# Patient Record
Sex: Male | Born: 1965 | Race: White | Hispanic: No | Marital: Single | Smoking: Current every day smoker
Health system: Southern US, Community
[De-identification: ages and names within clinical notes are randomized; demographics above are authoritative.]

## PROBLEM LIST (undated history)

## (undated) DIAGNOSIS — I10 Essential (primary) hypertension: Secondary | ICD-10-CM

## (undated) DIAGNOSIS — I2510 Atherosclerotic heart disease of native coronary artery without angina pectoris: Secondary | ICD-10-CM

## (undated) HISTORY — PX: CARDIAC CATHETERIZATION: SHX172

---

## 2004-05-11 ENCOUNTER — Emergency Department: Payer: Self-pay | Admitting: Emergency Medicine

## 2005-04-15 ENCOUNTER — Emergency Department: Payer: Self-pay | Admitting: Internal Medicine

## 2005-09-14 ENCOUNTER — Emergency Department: Payer: Self-pay | Admitting: Emergency Medicine

## 2005-09-17 ENCOUNTER — Other Ambulatory Visit: Payer: Self-pay

## 2005-09-17 ENCOUNTER — Emergency Department: Payer: Self-pay | Admitting: Emergency Medicine

## 2007-05-13 ENCOUNTER — Emergency Department: Payer: Self-pay | Admitting: Emergency Medicine

## 2007-10-04 ENCOUNTER — Emergency Department: Payer: Self-pay | Admitting: Emergency Medicine

## 2007-10-21 ENCOUNTER — Emergency Department: Payer: Self-pay | Admitting: Emergency Medicine

## 2009-06-11 ENCOUNTER — Emergency Department: Payer: Self-pay | Admitting: Emergency Medicine

## 2010-02-01 ENCOUNTER — Emergency Department: Payer: Self-pay | Admitting: Emergency Medicine

## 2010-03-13 ENCOUNTER — Ambulatory Visit: Payer: Self-pay | Admitting: Cardiovascular Disease

## 2011-07-25 ENCOUNTER — Emergency Department: Payer: Self-pay | Admitting: Emergency Medicine

## 2011-07-25 LAB — CBC
HCT: 43.9 % (ref 40.0–52.0)
HGB: 14.2 g/dL (ref 13.0–18.0)
MCH: 30.3 pg (ref 26.0–34.0)
MCHC: 32.3 g/dL (ref 32.0–36.0)
RDW: 13.2 % (ref 11.5–14.5)

## 2011-07-25 LAB — TROPONIN I: Troponin-I: <0.02 ng/mL

## 2011-07-25 LAB — COMPREHENSIVE METABOLIC PANEL
Anion Gap: 7 (ref 7–16)
BUN: 8 mg/dL (ref 7–18)
Bilirubin,Total: 0.5 mg/dL (ref 0.2–1.0)
Chloride: 107 mmol/L (ref 98–107)
Creatinine: 1.1 mg/dL (ref 0.60–1.30)
EGFR (African American): >60
Glucose: 103 mg/dL — ABNORMAL HIGH (ref 65–99)
Osmolality: 276 (ref 275–301)
Potassium: 3.8 mmol/L (ref 3.5–5.1)
Sodium: 139 mmol/L (ref 136–145)
Total Protein: 7.4 g/dL (ref 6.4–8.2)

## 2011-07-25 LAB — CK TOTAL AND CKMB (NOT AT ARMC)
CK, Total: 141 U/L (ref 35–232)
CK-MB: 1 ng/mL (ref 0.5–3.6)

## 2011-08-17 ENCOUNTER — Emergency Department: Payer: Self-pay | Admitting: Emergency Medicine

## 2014-10-29 ENCOUNTER — Inpatient Hospital Stay: Payer: Self-pay

## 2014-10-29 ENCOUNTER — Emergency Department: Payer: Self-pay

## 2014-10-29 ENCOUNTER — Inpatient Hospital Stay
Admission: EM | Admit: 2014-10-29 | Discharge: 2014-10-30 | DRG: 195 | Disposition: A | Discharge status: Home / Self Care | Payer: Self-pay | Attending: Internal Medicine | Admitting: Internal Medicine

## 2014-10-29 DIAGNOSIS — Z91013 Allergy to seafood: Secondary | ICD-10-CM

## 2014-10-29 DIAGNOSIS — I10 Essential (primary) hypertension: Secondary | ICD-10-CM | POA: Diagnosis present

## 2014-10-29 DIAGNOSIS — Z590 Homelessness: Secondary | ICD-10-CM

## 2014-10-29 DIAGNOSIS — I2510 Atherosclerotic heart disease of native coronary artery without angina pectoris: Secondary | ICD-10-CM | POA: Diagnosis present

## 2014-10-29 DIAGNOSIS — I160 Hypertensive urgency: Secondary | ICD-10-CM | POA: Diagnosis present

## 2014-10-29 DIAGNOSIS — F17210 Nicotine dependence, cigarettes, uncomplicated: Secondary | ICD-10-CM | POA: Diagnosis present

## 2014-10-29 DIAGNOSIS — R0789 Other chest pain: Secondary | ICD-10-CM

## 2014-10-29 DIAGNOSIS — J189 Pneumonia, unspecified organism: Principal | ICD-10-CM | POA: Diagnosis present

## 2014-10-29 DIAGNOSIS — R591 Generalized enlarged lymph nodes: Secondary | ICD-10-CM | POA: Diagnosis present

## 2014-10-29 DIAGNOSIS — I252 Old myocardial infarction: Secondary | ICD-10-CM

## 2014-10-29 DIAGNOSIS — R079 Chest pain, unspecified: Secondary | ICD-10-CM

## 2014-10-29 HISTORY — DX: Atherosclerotic heart disease of native coronary artery without angina pectoris: I25.10

## 2014-10-29 HISTORY — DX: Essential (primary) hypertension: I10

## 2014-10-29 LAB — BASIC METABOLIC PANEL
ANION GAP: 9 (ref 5–15)
BUN: 10 mg/dL (ref 6–20)
CALCIUM: 9 mg/dL (ref 8.9–10.3)
CO2: 27 mmol/L (ref 22–32)
CREATININE: 1.08 mg/dL (ref 0.61–1.24)
Chloride: 100 mmol/L — ABNORMAL LOW (ref 101–111)
Glucose, Bld: 101 mg/dL — ABNORMAL HIGH (ref 65–99)
Potassium: 3.5 mmol/L (ref 3.5–5.1)
SODIUM: 136 mmol/L (ref 135–145)

## 2014-10-29 LAB — CBC
HCT: 41.6 % (ref 40.0–52.0)
Hemoglobin: 14 g/dL (ref 13.0–18.0)
MCH: 30.4 pg (ref 26.0–34.0)
MCHC: 33.7 g/dL (ref 32.0–36.0)
MCV: 90.2 fL (ref 80.0–100.0)
PLATELETS: 174 10*3/uL (ref 150–440)
RBC: 4.61 MIL/uL (ref 4.40–5.90)
RDW: 13.5 % (ref 11.5–14.5)
WBC: 18.1 10*3/uL — ABNORMAL HIGH (ref 3.8–10.6)

## 2014-10-29 LAB — TROPONIN I: TROPONIN I: 0.03 ng/mL (ref <0.031)

## 2014-10-29 LAB — TSH: TSH: 0.562 u[IU]/mL (ref 0.350–4.500)

## 2014-10-29 MED ORDER — MORPHINE SULFATE (PF) 2 MG/ML IV SOLN
1.0000 mg | INTRAVENOUS | Status: DC | PRN
  Administered 2014-10-29: 1 mg via INTRAVENOUS
  Filled 2014-10-29: qty 1

## 2014-10-29 MED ORDER — IOHEXOL 350 MG/ML SOLN
75.0000 mL | Freq: Once | INTRAVENOUS | Status: AC | PRN
  Administered 2014-10-29: 75 mL via INTRAVENOUS

## 2014-10-29 MED ORDER — SODIUM CHLORIDE 0.9 % IJ SOLN
3.0000 mL | Freq: Two times a day (BID) | INTRAMUSCULAR | Status: DC
  Administered 2014-10-29 – 2014-10-30 (×2): 3 mL via INTRAVENOUS

## 2014-10-29 MED ORDER — ASPIRIN EC 81 MG PO TBEC
81.0000 mg | DELAYED_RELEASE_TABLET | Freq: Every day | ORAL | Status: DC
Start: 2014-10-29 — End: 2014-10-30
  Administered 2014-10-30: 81 mg via ORAL
  Filled 2014-10-29: qty 1

## 2014-10-29 MED ORDER — ACETAMINOPHEN 650 MG RE SUPP: 650.0000 mg | Freq: Four times a day (QID) | RECTAL | Status: DC | PRN

## 2014-10-29 MED ORDER — METOPROLOL TARTRATE 25 MG PO TABS
25.0000 mg | ORAL_TABLET | Freq: Two times a day (BID) | ORAL | Status: DC
  Administered 2014-10-29 – 2014-10-30 (×2): 25 mg via ORAL
  Filled 2014-10-29 (×2): qty 1

## 2014-10-29 MED ORDER — HYDROCODONE-ACETAMINOPHEN 5-325 MG PO TABS: 1.0000 | ORAL_TABLET | ORAL | Status: DC | PRN

## 2014-10-29 MED ORDER — HEPARIN SODIUM (PORCINE) 5000 UNIT/ML IJ SOLN
5000.0000 [IU] | Freq: Three times a day (TID) | INTRAMUSCULAR | Status: DC
  Filled 2014-10-29: qty 1

## 2014-10-29 MED ORDER — POLYETHYLENE GLYCOL 3350 17 G PO PACK: 17.0000 g | PACK | Freq: Every day | ORAL | Status: DC | PRN

## 2014-10-29 MED ORDER — SODIUM CHLORIDE 0.9 % IV BOLUS (SEPSIS)
1000.0000 mL | Freq: Once | INTRAVENOUS | Status: AC
  Administered 2014-10-29: 1000 mL via INTRAVENOUS

## 2014-10-29 MED ORDER — HYDRALAZINE HCL 20 MG/ML IJ SOLN
10.0000 mg | Freq: Four times a day (QID) | INTRAMUSCULAR | Status: DC | PRN
  Administered 2014-10-29: 10 mg via INTRAVENOUS
  Filled 2014-10-29: qty 1

## 2014-10-29 MED ORDER — DEXTROSE 5 % IV SOLN
1.0000 g | Freq: Once | INTRAVENOUS | Status: AC
  Administered 2014-10-29: 1 g via INTRAVENOUS
  Filled 2014-10-29 (×2): qty 10

## 2014-10-29 MED ORDER — ACETAMINOPHEN 325 MG PO TABS
650.0000 mg | ORAL_TABLET | Freq: Four times a day (QID) | ORAL | Status: DC | PRN
  Administered 2014-10-29: 650 mg via ORAL
  Filled 2014-10-29: qty 2

## 2014-10-29 MED ORDER — LABETALOL HCL 5 MG/ML IV SOLN
10.0000 mg | Freq: Once | INTRAVENOUS | Status: AC
  Administered 2014-10-29: 10 mg via INTRAVENOUS
  Filled 2014-10-29: qty 4

## 2014-10-29 MED ORDER — MORPHINE SULFATE (PF) 2 MG/ML IV SOLN
2.0000 mg | INTRAVENOUS | Status: DC | PRN
  Administered 2014-10-29 (×2): 2 mg via INTRAVENOUS
  Filled 2014-10-29 (×2): qty 1

## 2014-10-29 NOTE — ED Notes (Signed)
Pt back from CT at this time 

## 2014-10-29 NOTE — ED Notes (Signed)
Pt given sandwich tray and sprite per request. Pt calm at this time. Appears comfortable.

## 2014-10-29 NOTE — H&P (Signed)
[facility] - [place] [facility]   PATIENT NAME: Jesus Duarte    MR#:  [id number]  DATE OF BIRTH:  07/16/1965  DATE OF ADMISSION:  10/29/2014  PRIMARY CARE PHYSICIAN: No PCP Per Patient   REQUESTING/REFERRING PHYSICIAN: Dr Carollee MassedKaminski  CHIEF COMPLAINT:   Chief Complaint  Patient presents with  . Chest Pain   HISTORY OF PRESENT ILLNESS:  Jesus Duarte  is a 49 y.o. male with a known history of hypertension and coronary artery disease who has not taken any medications or received medical care for at least the past 5 years presents today with acute onset right-sided chest pain which started on waking this morning. He states that yesterday he was in his usual state of health and felt fine. This morning he woke up with a stabbing pain in the right side of his chest and a cough with minimum sputum production. He states that it is painful to take a deep breath and very painful in the right axilla. He has not had fevers, chills, sweats, nausea vomiting or diarrhea. He has not had any sick contacts. On emergency room evaluation he is found to be very hypertensive and to have a probable right middle lobe pneumonia.  PAST MEDICAL HISTORY:   Past Medical History  Diagnosis Date  . Hypertension   . Coronary artery disease     PAST SURGICAL HISTORY:   Past Surgical History  Procedure Laterality Date  . Cardiac catheterization      SOCIAL HISTORY:   Social History  Substance Use Topics  . Smoking status: Current Every Day Smoker -- 0.25 packs/day    Types: Cigarettes  . Smokeless tobacco: Not on file  . Alcohol Use: No    FAMILY HISTORY:   Family History  Problem Relation Age of Onset  . Colon cancer Mother     DRUG ALLERGIES:   Allergies  Allergen Reactions  . Crab [Shellfish Allergy] Anaphylaxis    REVIEW OF SYSTEMS:   Review of Systems  Constitutional: Negative for fever, chills, weight loss and malaise/fatigue.  HENT: Positive for  congestion. Negative for hearing loss.   Eyes: Negative for blurred vision and pain.  Respiratory: Positive for cough, sputum production and shortness of breath. Negative for hemoptysis and stridor.   Cardiovascular: Positive for chest pain. Negative for palpitations, orthopnea and leg swelling.  Gastrointestinal: Negative for nausea, vomiting, abdominal pain, diarrhea, constipation and blood in stool.  Genitourinary: Negative for dysuria and frequency.  Musculoskeletal: Negative for myalgias, back pain, joint pain and neck pain.  Skin: Negative for rash.  Neurological: Negative for focal weakness, loss of consciousness and headaches.  Endo/Heme/Allergies: Does not bruise/bleed easily.  Psychiatric/Behavioral: Negative for depression and hallucinations. The patient is not nervous/anxious.     MEDICATIONS AT HOME:   Prior to Admission medications   Medication Sig Start Date End Date Taking? Authorizing Provider  aspirin (BAYER ASPIRIN EC LOW DOSE) 81 MG EC tablet Take 81 mg by mouth daily. Swallow whole.   Yes Historical Provider, MD      VITAL SIGNS:  Blood pressure 218/117, pulse 107, temperature 98.8 F (37.1 C), temperature source Oral, resp. rate 21, height 5\' 7"  (1.702 m), weight 68.04 kg (150 lb), SpO2 97 %.  PHYSICAL EXAMINATION:  GENERAL:  49 y.o.-year-old patient lying in the bed, uncomfortable, short shallow respirations EYES: Pupils equal, round, reactive to light and accommodation. No scleral icterus. Extraocular muscles intact.  HEENT: Head atraumatic, normocephalic. Oropharynx and nasopharynx clear.  NECK:  Supple, no jugular venous distention. No thyroid enlargement, no tenderness. No lymphadenopathy LUNGS: Normal breath sounds bilaterally, no wheezing, rales, rhonchi or crepitation. Short shallow respirations, wincing in pain with deep inspiration  CARDIOVASCULAR: S1, S2 normal. No murmurs, rubs, or gallops. Tachycardic ABDOMEN: Soft, nontender, nondistended. Bowel  sounds present. No organomegaly or mass. No guarding no rebound EXTREMITIES: No pedal edema, cyanosis, or clubbing. Peripheral pulses are 2+ NEUROLOGIC: Cranial nerves II through XII are intact. Muscle strength 5/5 in all extremities. Sensation intact. Gait not checked.  PSYCHIATRIC: The patient is alert and oriented x 3. Anxious SKIN: No obvious rash, lesion, or ulcer.   LABORATORY PANEL:   CBC  Recent Labs Lab 10/29/14 1531  WBC 18.1*  HGB 14.0  HCT 41.6  PLT 174   ------------------------------------------------------------------------------------------------------------------  Chemistries   Recent Labs Lab 10/29/14 1531  NA 136  K 3.5  CL 100*  CO2 27  GLUCOSE 101*  BUN 10  CREATININE 1.08  CALCIUM 9.0   ------------------------------------------------------------------------------------------------------------------  Cardiac Enzymes  Recent Labs Lab 10/29/14 1531  TROPONINI <0.03   ------------------------------------------------------------------------------------------------------------------  RADIOLOGY:  Dg Chest 2 View  10/29/2014  CLINICAL DATA:  49 year old male with acute right chest pain and shortness of breath for 1 day. EXAM: CHEST  2 VIEW COMPARISON:  None. FINDINGS: The cardiomediastinal silhouette is unremarkable. Confluent right middle lobe opacity identified and may represent pneumonia but correlate clinically. There is no evidence of pneumothorax, pleural effusion or pulmonary edema. No acute bony abnormalities are identified. IMPRESSION: Right middle lobe opacity likely representing pneumonia but correlate clinically. If infection is not clinically suspected, recommend chest CT with contrast/CTA chest. Otherwise radiographic follow-up to resolution is recommended. Electronically Signed   By: Harmon Pier M.D.   On: 10/29/2014 15:56    EKG:   Orders placed or performed during the hospital encounter of 10/29/14  . EKG 12-Lead  . EKG 12-Lead   . ED EKG within 10 minutes  . ED EKG within 10 minutes    IMPRESSION AND PLAN:   #1 right-sided chest pain: Chest x-ray showing probable right middle lobe pneumonia and he does have a significant leukocytosis. I will obtain a CT angiogram to rule out PE as he is tachycardic and having pleuritic type chest pain. I think he most likely has pneumonia rather than a PE. Blood cultures have been drawn. He has been started on Rocephin.  #2 hypertensive urgency: Blood pressure 230/115 on presentation. He has received labetalol in the emergency room. If this is not successful we'll need to admit to the stepdown unit for management. Will start hydralazine as needed for hypertension. He denies vision change headache neurologic symptoms.  #3 history of coronary artery disease: We will cycle cardiac enzymes initial troponin is negative and first EKG shows sinus tachycardia. There are possible changes of old MI. Will obtain 2-D echocardiogram and admit to telemetry. Continue aspirin daily. Start beta blocker. Check lipids in a.m.  #4 homelessness: Child psychotherapist consultation  CODE STATUS: full  TOTAL TIME TAKING CARE OF THIS PATIENT: 45 minutes.  Greater than 50% of time spent in coordination of care and counseling.  Elby Showers M.D on 10/29/2014 at 4:57 PM  Between 7am to 6pm - Pager - [phone]  After 6pm go to [email or web address] - password [email or web address] [facility]  [facility]  Office  [phone]  CC: Primary care physician; No PCP Per Patient

## 2014-10-29 NOTE — ED Provider Notes (Signed)
[facility] Emergency Department Provider Note  ____________________________________________  Time seen: 1612  I have reviewed the triage vital signs and the nursing notes.   HISTORY  Chief Complaint Chest Pain     HPI Jesus Duarte is a 49 y.o. male who woke this morning with pain in his right chest and shortness of breath.  Patient is homeless. [place]. With the discomfort he had today, he was able to get a ride towards town. This was at 8 AM this morning. It took a while to finally get to the hospital. He reports ongoing pain in the right chest with shortness of breath. Taking a deep breath hurts. He is breathing a little bit shallow.  Patient does have a history of an MI in the past. This is an 2003. At this time, he has not had any ongoing medications, including medications for his hypertension.  Patient denies any fever. He does report he has some pain in his right shoulder and took an oxycodone the other day for this.     Past medical history: Positive for cardiac disease with myocardial infarction, catheterization in 2003. Hypertension, untreated  There are no active problems to display for this patient.   Past Surgical History  Procedure Laterality Date  . Cardiac catheterization      No current outpatient prescriptions on file.  Allergies Crab  No family history on file.  Social History Social History  Substance Use Topics  . Smoking status: Current Every Day Smoker  . Smokeless tobacco: None  . Alcohol Use: Yes    Review of Systems  Constitutional: Negative for fever. ENT: Negative for sore throat. Cardiovascular: Positive for chest pain. Respiratory: Pain with inspiration, shortness of breath. Gastrointestinal: Negative for abdominal pain, vomiting and diarrhea. Genitourinary: Negative for dysuria. Musculoskeletal: No myalgias or injuries. Skin: Negative for rash. Neurological:  Negative for paresthesia or weakness   10-point ROS otherwise negative.  ____________________________________________   PHYSICAL EXAM:  VITAL SIGNS: ED Triage Vitals  Enc Vitals Group     BP 10/29/14 1519 225/112 mmHg     Pulse Rate 10/29/14 1519 108     Resp 10/29/14 1519 20     Temp 10/29/14 1519 98.8 F (37.1 C)     Temp Source 10/29/14 1519 Oral     SpO2 10/29/14 1519 100 %     Weight 10/29/14 1519 150 lb (68.04 kg)     Height 10/29/14 1519 5\' 7"  (1.702 m)     Head Cir --      Peak Flow --      Pain Score 10/29/14 1520 10     Pain Loc --      Pain Edu? --      Excl. in GC? --     Constitutional: Alert and oriented. Appears uncomfortable with shallow breaths, mild tachypnea, but communicative. ENT   Head: Normocephalic and atraumatic.   Nose: No congestion/rhinnorhea.    Cardiovascular: Tachycardic at 1:15, regular rhythm, no murmur noted Respiratory:  Mild increased risk for a rate with shallow breathing and pain on inspiration. No wheezing. Breath sounds overall equal bilaterally. Gastrointestinal: Soft and nontender. No distention.  Back: No muscle spasm, no tenderness, no CVA tenderness. Musculoskeletal: No deformity noted. Nontender with normal range of motion in all extremities.  No noted edema. Neurologic:  Normal speech and language. No gross focal neurologic deficits are appreciated.  Skin:  Skin is warm, dry. No rash noted. Psychiatric: Mood and  affect are normal. Speech and behavior are normal.  ____________________________________________    LABS (pertinent positives/negatives)  Labs Reviewed  BASIC METABOLIC PANEL - Abnormal; Notable for the following:    Chloride 100 (*)    Glucose, Bld 101 (*)    All other components within normal limits  CBC - Abnormal; Notable for the following:    WBC 18.1 (*)    All other components within normal limits  CULTURE, BLOOD (ROUTINE X 2)  CULTURE, BLOOD (ROUTINE X 2)  TROPONIN I      ____________________________________________   EKG  ED ECG REPORT I, Kyllie Pettijohn W, the attending physician, personally viewed and interpreted this ECG.   Date: 10/29/2014  EKG Time: 1515  Rate: 107  Rhythm: Sinus tachycardia  Axis: Normal  Intervals: Normal  ST&T Change: No ST elevation or depression noted. Slightly tall T waves in V3 through V5   ____________________________________________    RADIOLOGY   Chest x-ray: IMPRESSION: Right middle lobe opacity likely representing pneumonia but correlate clinically. If infection is not clinically suspected, recommend chest CT with contrast/CTA chest. Otherwise radiographic follow-up to resolution is recommended.  ____________________________________________   PROCEDURES  CRITICAL CARE Performed by: Darien Ramus   Total critical care time: 30 minutes due to the respiratory distress issues with this patient with significant pneumonia, leukocytosis, tachycardia, and tachypnea.  Critical care time was exclusive of separately billable procedures and treating other patients.  Critical care was necessary to treat or prevent imminent or life-threatening deterioration.  Critical care was time spent personally by me on the following activities: development of treatment plan with patient and/or surrogate as well as nursing, discussions with consultants, evaluation of patient's response to treatment, examination of patient, obtaining history from patient or surrogate, ordering and performing treatments and interventions, ordering and review of laboratory studies, ordering and review of radiographic studies, pulse oximetry and re-evaluation of patient's condition.   ____________________________________________   INITIAL IMPRESSION / ASSESSMENT AND PLAN / ED COURSE  Pertinent labs & imaging results that were available during my care of the patient were reviewed by me and considered in my medical decision making (see  chart for details).  Well-nourished well-developed muscular-appearing 49 year old male who is homeless and has no ongoing health care. He is not on any blood pressure medications currently. He presents with acute right-sided chest pain, pleuritic in nature, with shortness of breath.  Chest x-ray shows a pneumonia on the right. He does have a leukocytosis.  We will start providing IV fluids and also treat his elevated blood pressure with labetalol IV.  We will move to admit the patient to the hospital and speak with the [facility] hospitalist service for evaluation.  ____________________________________________   FINAL CLINICAL IMPRESSION(S) / ED DIAGNOSES  Final diagnoses:  Community acquired pneumonia  Other chest pain  Hypertensive urgency      Darien Ramus, MD 10/29/14 2356

## 2014-10-29 NOTE — ED Notes (Addendum)
Pt c/o waking this morning with right sided  Pain in chest, worse with movement and deep breathing..Marland Kitchen

## 2014-10-29 NOTE — ED Notes (Signed)
Pt states he awoke with CP to right chest, sharp and shooting, constant since this AM. Pt appears anxious, states he is scared. Pt hx of hypertension, does not take medications. Pt talking quickly. Color WNL. Nausea and vomiting this AM, SOB.

## 2014-10-29 NOTE — ED Notes (Signed)
MD at bedside. 

## 2014-10-29 NOTE — ED Notes (Signed)
Pt to CT

## 2014-10-29 NOTE — ED Notes (Signed)
Pt up in room walking around, taking monitor cords off. Pt states that he is leaving and going to [facility]. Pt states that his pain was controlled until "he coughed". Pt states that he is in pain. Explained to patient the need to stay. Pt appears angry, irritated. Dr Clent RidgesWalsh paged, in room to speak with patient. Orders for morphine given. Given to patient. Pt sitting on side of bed on telephone texting, pt will not wear BP cuff at this time.

## 2014-10-30 ENCOUNTER — Inpatient Hospital Stay
Admit: 2014-10-30 | Discharge: 2014-10-30 | Disposition: A | Discharge status: Home / Self Care | Payer: Self-pay | Attending: Internal Medicine | Admitting: Internal Medicine

## 2014-10-30 LAB — BASIC METABOLIC PANEL
Anion gap: 7 (ref 5–15)
BUN: 13 mg/dL (ref 6–20)
CHLORIDE: 103 mmol/L (ref 101–111)
CO2: 26 mmol/L (ref 22–32)
CREATININE: 0.91 mg/dL (ref 0.61–1.24)
Calcium: 8.4 mg/dL — ABNORMAL LOW (ref 8.9–10.3)
GFR calc Af Amer: >60 mL/min (ref >60)
GFR calc non Af Amer: >60 mL/min (ref >60)
GLUCOSE: 104 mg/dL — AB (ref 65–99)
POTASSIUM: 3.7 mmol/L (ref 3.5–5.1)
SODIUM: 136 mmol/L (ref 135–145)

## 2014-10-30 LAB — CBC
HCT: 38.8 % — ABNORMAL LOW (ref 40.0–52.0)
HEMOGLOBIN: 13 g/dL (ref 13.0–18.0)
MCH: 30.1 pg (ref 26.0–34.0)
MCHC: 33.4 g/dL (ref 32.0–36.0)
MCV: 90.1 fL (ref 80.0–100.0)
PLATELETS: 161 10*3/uL (ref 150–440)
RBC: 4.3 MIL/uL — AB (ref 4.40–5.90)
RDW: 13.4 % (ref 11.5–14.5)
WBC: 16.8 10*3/uL — ABNORMAL HIGH (ref 3.8–10.6)

## 2014-10-30 LAB — TROPONIN I
TROPONIN I: 0.03 ng/mL (ref <0.031)
TROPONIN I: 0.04 ng/mL — AB (ref <0.031)

## 2014-10-30 LAB — LIPID PANEL
CHOL/HDL RATIO: 3.2 ratio
CHOLESTEROL: 98 mg/dL (ref 0–200)
HDL: 31 mg/dL — ABNORMAL LOW (ref >40)
LDL Cholesterol: 56 mg/dL (ref 0–99)
TRIGLYCERIDES: 54 mg/dL (ref <150)
VLDL: 11 mg/dL (ref 0–40)

## 2014-10-30 MED ORDER — METOPROLOL TARTRATE 25 MG PO TABS: 25.0000 mg | ORAL_TABLET | Freq: Two times a day (BID) | ORAL | Status: DC

## 2014-10-30 MED ORDER — DEXTROSE 5 % IV SOLN
1.0000 g | INTRAVENOUS | Status: DC
  Filled 2014-10-30: qty 10

## 2014-10-30 MED ORDER — AMOXICILLIN-POT CLAVULANATE 875-125 MG PO TABS: 1.0000 | ORAL_TABLET | Freq: Two times a day (BID) | ORAL | Status: AC

## 2014-10-30 NOTE — Progress Notes (Signed)
*  PRELIMINARY RESULTS* Echocardiogram 2D Echocardiogram has been performed.  Georgann HousekeeperJerry R Hege 10/30/2014, 3:44 PM

## 2014-10-30 NOTE — Discharge Instructions (Signed)

## 2014-10-30 NOTE — Care Management (Signed)
CM will follow patient for medication assistance.

## 2014-10-30 NOTE — Clinical Social Work Note (Signed)
Clinical Social Work Assessment  Patient Details  Name: Jesus Duarte MRN: [id number] Date of Birth: 04/08/1965  Date of referral:  10/30/14               Reason for consult:  Housing Concerns/Homelessness                Permission sought to share information with:  Family Supports Permission granted to share information::  Yes, Verbal Permission Granted  Name::        Agency::     Relationship::     Contact Information:     Housing/Transportation Living arrangements for the past 2 months:  Homeless Source of Information:  Patient Patient Interpreter Needed:  None Criminal Activity/Legal Involvement Pertinent to Current Situation/Hospitalization:  No - Comment as needed Significant Relationships:  Other Family Members Lives with:  Self Do you feel safe going back to the place where you live?  Yes Need for family participation in patient care:  No (Coment)  Care giving concerns:  Patient is able to care for himself   Social Worker assessment / plan:  CSW received consult for homelessness concerns. CSW went to see patient and patient was on the phone visibly frustrated trying to find a ride. Patient had visitors that he confirmed were family (an older lady and a young child). The visitors did not say anything. Patient got off of phone and CSW attempted to speak with him regarding his current situation. Patient stated he was trying to find a ride out of the hospital because he stated that he was told he would discharge today. When asked if he was also working on housing, he replied "yes I need a house, can you find me a house?" I asked if he was an [place] resident and he would not answer this directly. He stated that he did not have a permanent [place] address but that he had been in [place] a while. When CSW offered the homeless shelter, patient replied that he was not going to the homeless shelter and that he "lived in that area anyway." Patient stated he had a  lot of family in the area and that he would stay in that area but he would not go to the homeless shelter. Patient then got back on one of the cell phones that was in front of him. CSW completed assessment at that time.  Employment status:  Unemployed Health and safety inspectornsurance information:  Self Pay (Medicaid Pending) PT Recommendations:    Information / Referral to community resources:   homeless shelter  Patient/Family's Response to care:  Patient declined assistance of the homeless shelter.  Patient/Family's Understanding of and Emotional Response to Diagnosis, Current Treatment, and Prognosis:  Patient frustrated at the current task of finding a ride from the hospital.  Emotional Assessment Appearance:  Appears stated age, Disheveled Attitude/Demeanor/Rapport:   (frustrated) Affect (typically observed):    Orientation:  Oriented to Self, Oriented to Place, Oriented to  Time, Oriented to Situation Alcohol / Substance use:  Not Applicable Psych involvement (Current and /or in the community):  No (Comment)  Discharge Needs  Concerns to be addressed:  Homelessness Readmission within the last 30 days:  No Current discharge risk:  Homeless Barriers to Discharge:  No Barriers Identified   York SpanielMonica Cade Dashner, LCSW 10/30/2014, 2:02 PM

## 2014-10-30 NOTE — Care Management (Addendum)
Patient is homeless but says that he is going to call his Alisia FerrariUncle Ken to come and pick him up and "is making plans to stay in [place].  Patient is choosing to discharge today.  Attending spoke with patient about his diagnosis of pneumonia with temp last evening of 102.  Patient says was seen at [facility] about 5 years ago.  "Lost  his medicaid due to Obama."  Confirmed that [facility] has Augmentin and metoprolol  on hand.  Script faxed and provided patient with applications for [facility] and [facility].

## 2014-10-30 NOTE — Progress Notes (Signed)
Patient d/c'd home. Education provided, no questions at this time. Patient to be picked up by family friend. Telemetry removed. Jolicia Delira R Mansfield  

## 2014-10-30 NOTE — Progress Notes (Signed)
MD made aware of troponin of 0.04. No new orders received. Cont to monitor

## 2014-10-31 NOTE — Discharge Summary (Signed)
[facility] - [place] at [facility]   PATIENT NAME: Jesus Duarte    MR#:  [id number]  DATE OF BIRTH:  1965/07/05  DATE OF ADMISSION:  10/29/2014 ADMITTING PHYSICIAN: Gale Journey, MD  DATE OF DISCHARGE: 10/30/2014  3:42 PM  PRIMARY CARE PHYSICIAN: [facility]  ADMISSION DIAGNOSIS:  Other chest pain [R07.89] Community acquired pneumonia [J18.9] Hypertensive urgency [I16.0] Right-sided chest pain [R07.9] DISCHARGE DIAGNOSIS:  Active Problems:   Hypertensive urgency  pneumonia SECONDARY DIAGNOSIS:   Past Medical History  Diagnosis Date  . Hypertension   . Coronary artery disease    HOSPITAL COURSE:  49 y.o. male with a known history of hypertension and coronary artery disease who has not taken any medications or received medical care for at least the past 5 years ADMITTED FOR acute onset right-sided chest pain.  See Dr. Claris Che dictated history and physical for further details.  Patient was ruled out with 3 negative serial troponins.  Due to his ongoing chest pain.  He underwent CT scan of the chest which showed pneumonia which was thought to be contributed into his chest pain as he also had lymphadenopathy.  He was started on antibiotics and was improving, although he did have some fever, but he was very adamant wanting to go out of the hospital.  He was explained the risks and benefit of staying in the hospital.  Considering his ongoing fever due to pneumonia.  He and his family agreed to leave with antibiotics and monitor at home.  Discharged home in stable condition. DISCHARGE CONDITIONS:  stable CONSULTS OBTAINED:    DRUG ALLERGIES:   Allergies  Allergen Reactions  . Crab [Shellfish Allergy] Anaphylaxis   DISCHARGE MEDICATIONS:   Discharge Medication List as of 10/30/2014  3:10 PM    START taking these medications   Details  amoxicillin-clavulanate (AUGMENTIN) 875-125 MG tablet Take 1 tablet by mouth 2 (two) times daily., Starting  10/30/2014, Until Mon 11/06/14, Normal    metoprolol tartrate (LOPRESSOR) 25 MG tablet Take 1 tablet (25 mg total) by mouth 2 (two) times daily., Starting 10/30/2014, Until Discontinued, Normal      CONTINUE these medications which have NOT CHANGED   Details  aspirin (BAYER ASPIRIN EC LOW DOSE) 81 MG EC tablet Take 81 mg by mouth daily. Swallow whole., Until Discontinued, Historical Med       DISCHARGE INSTRUCTIONS:   DIET:  Regular diet DISCHARGE CONDITION:  Stable ACTIVITY:  Activity as tolerated OXYGEN:  Home Oxygen: No.  Oxygen Delivery: room air DISCHARGE LOCATION:  home   If you experience worsening of your admission symptoms, develop shortness of breath, life threatening emergency, suicidal or homicidal thoughts you must seek medical attention immediately by calling 911 or calling your MD immediately  if symptoms less severe.  You Must read complete instructions/literature along with all the possible adverse reactions/side effects for all the Medicines you take and that have been prescribed to you. Take any new Medicines after you have completely understood and accpet all the possible adverse reactions/side effects.   Please note  You were cared for by a hospitalist during your hospital stay. If you have any questions about your discharge medications or the care you received while you were in the hospital after you are discharged, you can call the unit and asked to speak with the hospitalist on call if the hospitalist that took care of you is not available. Once you are discharged, your primary care physician will handle any further  medical issues. Please note that NO REFILLS for any discharge medications will be authorized once you are discharged, as it is imperative that you return to your primary care physician (or establish a relationship with a primary care physician if you do not have one) for your aftercare needs so that they can reassess your need for medications and  monitor your lab values.    On the day of Discharge:  VITAL SIGNS:  Blood pressure 163/93, pulse 75, temperature 97.5 F (36.4 C), temperature source Oral, resp. rate 18, height 5\' 7"  (1.702 m), weight 68.04 kg (150 lb), SpO2 98 %. PHYSICAL EXAMINATION:  GENERAL:  49 y.o.-year-old patient lying in the bed with no acute distress.  EYES: Pupils equal, round, reactive to light and accommodation. No scleral icterus. Extraocular muscles intact.  HEENT: Head atraumatic, normocephalic. Oropharynx and nasopharynx clear.  NECK:  Supple, no jugular venous distention. No thyroid enlargement, no tenderness.  LUNGS: Normal breath sounds bilaterally, no wheezing, rales,rhonchi or crepitation. No use of accessory muscles of respiration.  CARDIOVASCULAR: S1, S2 normal. No murmurs, rubs, or gallops.  ABDOMEN: Soft, non-tender, non-distended. Bowel sounds present. No organomegaly or mass.  EXTREMITIES: No pedal edema, cyanosis, or clubbing.  NEUROLOGIC: Cranial nerves II through XII are intact. Muscle strength 5/5 in all extremities. Sensation intact. Gait not checked.  PSYCHIATRIC: The patient is alert and oriented x 3.  SKIN: No obvious rash, lesion, or ulcer.  DATA REVIEW:   CBC  Recent Labs Lab 10/30/14 0459  WBC 16.8*  HGB 13.0  HCT 38.8*  PLT 161    Chemistries   Recent Labs Lab 10/30/14 0459  NA 136  K 3.7  CL 103  CO2 26  GLUCOSE 104*  BUN 13  CREATININE 0.91  CALCIUM 8.4*    Cardiac Enzymes  Recent Labs Lab 10/30/14 0459  TROPONINI 0.04*   RADIOLOGY:  Ct Angio Chest Pe W/cm &/or Wo Cm  10/29/2014  CLINICAL DATA:  Right chest pain EXAM: CT ANGIOGRAPHY CHEST WITH CONTRAST TECHNIQUE: Multidetector CT imaging of the chest was performed using the standard protocol during bolus administration of intravenous contrast. Multiplanar CT image reconstructions and MIPs were obtained to evaluate the vascular anatomy. CONTRAST:  75mL OMNIPAQUE IOHEXOL 350 MG/ML SOLN COMPARISON:   Chest radiographs dated 10/29/2014 FINDINGS: No evidence of pulmonary embolism. Mediastinum/Nodes: The heart is normal in size. No pericardial effusion. 20 mm short axis right hilar/infrahilar node (series 4/ image 31). Visualized thyroid is unremarkable. Lungs/Pleura: Patchy opacity in the inferomedial right upper lobe (series 6/ image 101), corresponding to the radiographic abnormality, compatible with pneumonia. Mild dependent atelectasis in the bilateral lower lobes. No suspicious pulmonary nodules. Mild centrilobular and paraseptal emphysematous changes. No pleural effusion or pneumothorax. Upper abdomen: Visualized upper abdomen is unremarkable. Musculoskeletal: Mild degenerative changes of the lower cervical and lower thoracic spine. Review of the MIP images confirms the above findings. IMPRESSION: No evidence of pulmonary embolism. Patchy opacity in the inferomedial right upper lobe, corresponding to the radiographic abnormality, compatible with pneumonia. Right hilar/infrahilar lymph node, likely reactive. Electronically Signed   By: Charline BillsSriyesh  Krishnan M.D.   On: 10/29/2014 17:28     Management plans discussed with the patient, family and they are in agreement.  CODE STATUS: Full code  TOTAL TIME TAKING CARE OF THIS PATIENT: 55 minutes.    [facility], Soumya Colson M.D on 10/31/2014 at 5:06 PM  Between 7am to 6pm - Pager - [phone]  After 6pm go to [email or web address] - password [email or web address] [facility]  [place]  [place] [facility]  Office  [phone]  CC: Primary care physician; [facility]

## 2014-11-04 LAB — CULTURE, BLOOD (ROUTINE X 2)
CULTURE: NO GROWTH
Culture: NO GROWTH

## 2017-04-09 ENCOUNTER — Ambulatory Visit: Payer: Self-pay | Admitting: Ophthalmology

## 2017-04-09 LAB — HM DIABETES EYE EXAM

## 2017-04-14 ENCOUNTER — Ambulatory Visit: Payer: Self-pay | Admitting: Family Medicine

## 2017-04-14 VITALS — BP 232/125 | HR 98 | Temp 98.2°F | Wt 157.1 lb

## 2017-04-14 DIAGNOSIS — Z0000 Encounter for general adult medical examination without abnormal findings: Secondary | ICD-10-CM

## 2017-04-14 NOTE — Progress Notes (Signed)
   Subjective:    Patient ID: Jesus Duarte, male    DOB: 12/11/1965, 52 y.o.   MRN: [id number]  HPI   Jesus Duarte is a 52 yo male here for f/u of blurry vision bilateral. 10 days ago, he woke up blind. He saw the Optometrist last week and was given Rx glasses and was referred to us to f/u for BP and diabetes. He reports his vision has improved and has not experienced blurry vision or blindness.  HTN: Pt has history of HTN. BP is elevated 232/125 and pt reports that is "normal" for him. He was Rx Lopressor 25mg  10 yrs ago but stopped taking it because he didn't want to take it.   Patient Active Problem List   Diagnosis Date Noted  . Hypertensive urgency 10/29/2014   Allergies as of 04/14/2017      Reactions   Crab [shellfish Allergy] Anaphylaxis   Eggs Or Egg-derived Products Swelling      Medication List        Accurate as of 04/14/17  7:57 PM. Always use your most recent med list.          BAYER ASPIRIN EC LOW DOSE 81 MG EC tablet Generic drug:  aspirin Take 81 mg by mouth daily. Swallow whole.   metoprolol tartrate 25 MG tablet Commonly known as:  LOPRESSOR Take 1 tablet (25 mg total) by mouth 2 (two) times daily.        Review of Systems     Objective:   Physical Exam  Constitutional: He is oriented to person, place, and time. He appears well-developed and well-nourished.  Cardiovascular: Normal rate, regular rhythm and normal heart sounds.  Pulmonary/Chest: Effort normal and breath sounds normal.  Abdominal: Soft. Bowel sounds are normal.  Neurological: He is alert and oriented to person, place, and time.  Vitals reviewed.   BP (!) 232/125 (BP Location: Right Arm, Patient Position: Sitting)   Pulse 98   Temp 98.2 F (36.8 C)   Wt 157 lb 1.6 oz (71.3 kg)   BMI 24.61 kg/m        Assessment & Plan:   Vision: Keep f/u apt for optho. Discussed w/ pt to go to ED if he experiences blindness again.   HTN: Not on meds at this time per pt preference.    Discussed w/ pt signs of heart attack and stroke and to call 911.   Pt refused for assistance w/ other medical issues.   F/u in 3 mo for maintenance of HTN

## 2017-04-15 ENCOUNTER — Other Ambulatory Visit: Payer: Self-pay | Admitting: Ophthalmology

## 2017-07-09 ENCOUNTER — Encounter: Payer: Self-pay | Admitting: Emergency Medicine

## 2017-07-09 ENCOUNTER — Emergency Department: Payer: Self-pay

## 2017-07-09 ENCOUNTER — Emergency Department
Admission: EM | Admit: 2017-07-09 | Discharge: 2017-07-09 | Disposition: A | Discharge status: Home / Self Care | Payer: Self-pay | Attending: Emergency Medicine | Admitting: Emergency Medicine

## 2017-07-09 DIAGNOSIS — F17210 Nicotine dependence, cigarettes, uncomplicated: Secondary | ICD-10-CM | POA: Insufficient documentation

## 2017-07-09 DIAGNOSIS — W010XXA Fall on same level from slipping, tripping and stumbling without subsequent striking against object, initial encounter: Secondary | ICD-10-CM | POA: Insufficient documentation

## 2017-07-09 DIAGNOSIS — Z7982 Long term (current) use of aspirin: Secondary | ICD-10-CM | POA: Insufficient documentation

## 2017-07-09 DIAGNOSIS — Y939 Activity, unspecified: Secondary | ICD-10-CM | POA: Insufficient documentation

## 2017-07-09 DIAGNOSIS — I10 Essential (primary) hypertension: Secondary | ICD-10-CM

## 2017-07-09 DIAGNOSIS — I2510 Atherosclerotic heart disease of native coronary artery without angina pectoris: Secondary | ICD-10-CM | POA: Insufficient documentation

## 2017-07-09 DIAGNOSIS — Y929 Unspecified place or not applicable: Secondary | ICD-10-CM | POA: Insufficient documentation

## 2017-07-09 DIAGNOSIS — K029 Dental caries, unspecified: Secondary | ICD-10-CM | POA: Insufficient documentation

## 2017-07-09 DIAGNOSIS — S82831A Other fracture of upper and lower end of right fibula, initial encounter for closed fracture: Secondary | ICD-10-CM | POA: Insufficient documentation

## 2017-07-09 DIAGNOSIS — I119 Hypertensive heart disease without heart failure: Secondary | ICD-10-CM | POA: Insufficient documentation

## 2017-07-09 DIAGNOSIS — Y999 Unspecified external cause status: Secondary | ICD-10-CM | POA: Insufficient documentation

## 2017-07-09 MED ORDER — NAPROXEN 500 MG PO TABS
500.0000 mg | ORAL_TABLET | Freq: Once | ORAL | Status: AC
  Administered 2017-07-09: 500 mg via ORAL
  Filled 2017-07-09: qty 1

## 2017-07-09 MED ORDER — NAPROXEN 500 MG PO TABS: 500.0000 mg | ORAL_TABLET | Freq: Two times a day (BID) | ORAL | 0 refills | Status: AC

## 2017-07-09 NOTE — ED Triage Notes (Signed)
Pt comes into the ED via POV c/o foot and ankle pain.  Patient states he walked off his porch and fell.  Patient has swelling to the right foot at this time but is ambulatory on crutches at this time.  Patient in NAD at this time with even and unlabored respirations.

## 2017-07-09 NOTE — Discharge Instructions (Addendum)
Your blood pressure is very high today. You will need to see a primary care provider as soon as possible. Call and schedule a follow up appointment with podiatry. Do not remove the cast. Avoid weight bearing and use your crutches any time you are up. Apply ice over the cast 20 minutes per hour while awake. Return to the ER for symptoms that change or worsen or for new concerns.

## 2017-07-09 NOTE — ED Provider Notes (Signed)
[facility] Emergency Department Provider Note ____________________________________________  Time seen: Approximately 3:24 PM  I have reviewed the triage vital signs and the nursing notes.   HISTORY  Chief Complaint Foot Pain    HPI Jesus Duarte is a 52 y.o. male who presents to the emergency department for evaluation and treatment of right ankle and foot pain.Patient states he "fell down while lying flat" last night. He also states that he "might have walked off of the porch." He also states that he was "labeled a druggie" because [facility] was delivering heroin to his driveway then decided they weren't going to do it anymore and now no one will help him with pain. He feels he needs a morphine prescription and a sulfa antibiotic prescription for chronic dental pain. He is aware that his blood pressure is "always real high" but it "doesn't bother" him. He denies any alleviating measures for the pain in his foot and ankle prior to arrival.  Past Medical History:  Diagnosis Date  . Coronary artery disease   . Hypertension     Patient Active Problem List   Diagnosis Date Noted  . Hypertensive urgency 10/29/2014    Past Surgical History:  Procedure Laterality Date  . CARDIAC CATHETERIZATION      Prior to Admission medications   Medication Sig Start Date End Date Taking? Authorizing Provider  aspirin (BAYER ASPIRIN EC LOW DOSE) 81 MG EC tablet Take 81 mg by mouth daily. Swallow whole.    [provider]  naproxen (NAPROSYN) 500 MG tablet Take 1 tablet (500 mg total) by mouth 2 (two) times daily with a meal. 07/09/17   Shaquayla Klimas B, FNP    Allergies Crab [shellfish allergy] and Eggs or egg-derived products  Family History  Problem Relation Age of Onset  . Colon cancer Mother     Social History Social History   Tobacco Use  . Smoking status: Current Every Day Smoker    Packs/day: 0.25    Types: Cigarettes    Start date: 24  .  Smokeless tobacco: Never Used  Substance Use Topics  . Alcohol use: No  . Drug use: No    Review of Systems Constitutional: Negative for fever. Cardiovascular: Negative for chest pain. Respiratory: Negative for shortness of breath. Musculoskeletal: Positive for right ankle and foot pain. Skin: Positive for swelling of the left foot and ankle.  Neurological: Negative for decrease in sensation  ____________________________________________   PHYSICAL EXAM:  VITAL SIGNS: ED Triage Vitals  Enc Vitals Group     BP 07/09/17 1334 (!) 244/136     Pulse Rate 07/09/17 1332 (!) 106     Resp 07/09/17 1332 20     Temp 07/09/17 1332 98.2 F (36.8 C)     Temp Source 07/09/17 1332 Oral     SpO2 07/09/17 1332 98 %     Weight 07/09/17 1333 160 lb (72.6 kg)     Height 07/09/17 1333 5\' 7"  (1.702 m)     Head Circumference --      Peak Flow --      Pain Score 07/09/17 1333 10     Pain Loc --      Pain Edu? --      Excl. in GC? --     Constitutional: Alert and oriented. Well appearing and in no acute distress. Eyes: Conjunctivae are clear without discharge or drainage Head: Atraumatic Neck: Supple Respiratory: No cough. Respirations are even and unlabored. Musculoskeletal: Focal tenderness over the  right distal fibula. Limited ROM of the right foot due to pain. Neurologic: Motor and sensation of the right foot, ankle, and toes is intact.  Skin: Intact  Psychiatric: Affect and behavior are appropriate.  ____________________________________________   LABS (all labs ordered are listed, but only abnormal results are displayed)  Labs Reviewed - No data to display ____________________________________________  RADIOLOGY  Right distal fibular fracture with associated soft tissue swelling. ____________________________________________   PROCEDURES  .Splint Application Date/Time: 07/09/2017 4:27 PM Performed by: Candis MusaMoore, Felicia L, NT Authorized by: Chinita Pesterriplett, Noemie Devivo B, FNP   Consent:     Consent obtained:  Verbal   Consent given by:  Patient   Risks discussed:  Pain Pre-procedure details:    Sensation:  Normal Procedure details:    Laterality:  Right   Location:  Ankle   Cast type:  Short leg   Supplies:  Cotton padding, elastic bandage and Ortho-Glass Post-procedure details:    Pain:  Unchanged   Sensation:  Normal   Patient tolerance of procedure:  Tolerated well, no immediate complications  ____________________________________________   INITIAL IMPRESSION / ASSESSMENT AND PLAN / ED COURSE  Jesus Duarte is a 52 y.o. who presents to the emergency department for treatment and evaluation of right foot and ankle pain. Image and exam is consistent with fibula fracture. He was placed in a OCL and is to follow up with orthopedics.  Also, he is hypertensive but states that is his normal. He was given warnings and strongly encouraged to follow up with [facility] as he has in the past to monitor and manage his BP and medication. He is to return to the ER for any symptom of concern.  Patient states he is not driving but was still discharged home with a prescription for naprosyn. In light of his earlier odd responses to questions and conversation he was not given any controlled substances.  Patient instructed to follow-up with orthopedics or podiatry next week.  He was also instructed to return to the emergency department for symptoms that change or worsen if unable schedule an appointment with orthopedics or primary care.  Medications  naproxen (NAPROSYN) tablet 500 mg (500 mg Oral Given 07/09/17 1552)    Pertinent labs & imaging results that were available during my care of the patient were reviewed by me and considered in my medical decision making (see chart for details).  _________________________________________   FINAL CLINICAL IMPRESSION(S) / ED DIAGNOSES  Final diagnoses:  Closed avulsion fracture of distal end of right fibula, initial encounter   Hypertension, unspecified type  Dental caries    ED Discharge Orders        Ordered    naproxen (NAPROSYN) 500 MG tablet  2 times daily with meals     07/09/17 1523       If controlled substance prescribed during this visit, 12 month history viewed on the [facility] prior to issuing an initial prescription for Schedule II or III opiod.    Chinita Pesterriplett, Cayci Mcnabb B, FNP 07/09/17 1637    Nita SickleVeronese, [place], MD 07/10/17 [phone]

## 2017-07-09 NOTE — ED Notes (Signed)
Pt using crutches, no shoes or socks on, declined wheelchair. Pt states he is not on BP meds at this time and does not wish to be.  Pt states he fell stepping off of his porch last night. C/o pain to right foot. Able to bear some weight.

## 2017-07-14 ENCOUNTER — Ambulatory Visit: Payer: Self-pay

## 2017-07-15 ENCOUNTER — Ambulatory Visit: Payer: Self-pay | Admitting: Specialist

## 2017-07-15 ENCOUNTER — Encounter: Payer: Self-pay | Admitting: Specialist

## 2017-07-15 DIAGNOSIS — M25571 Pain in right ankle and joints of right foot: Secondary | ICD-10-CM

## 2017-07-15 NOTE — Progress Notes (Signed)
   Subjective:    Patient ID: Jesus Duarte, male    DOB: 09/17/1965, 52 y.o.   MRN: [id number]  HPI 52 year old who fell on July 4th injured his right ankle. He was seen in the ED and was told he had a fractured distal fibula. He was put in a posterior mold; he did not wear it today as he knew he was seeing us today. (I believe we have a serious question about compliance).  He is using a SPC held in his right hand.  Review of the x-rays reveal an undisplaced fracture distal fibula. The ankle mortise is intact.   Review of Systems     Objective:   Physical Exam  On inspection, he has a mild amount of swelling. He has ecchymosis over the lateral aspect of the ankle and heel. He is moderately TTP over the lateral malleolus.        Assessment & Plan:  Plan: Continue the immobilization he was given in the ED. I want to see him back in two weeks with a repeat x-ray.

## 2017-07-29 ENCOUNTER — Ambulatory Visit: Payer: Self-pay | Admitting: Specialist

## 2017-08-19 ENCOUNTER — Ambulatory Visit: Payer: Self-pay | Admitting: Specialist

## 2017-10-15 ENCOUNTER — Ambulatory Visit: Payer: Self-pay | Admitting: Ophthalmology

## 2017-10-22 ENCOUNTER — Ambulatory Visit: Payer: Self-pay | Admitting: Ophthalmology

## 2018-02-02 ENCOUNTER — Emergency Department
Admission: EM | Admit: 2018-02-02 | Discharge: 2018-02-03 | Disposition: A | Discharge status: Other Acute Inpatient Hospital | Payer: Medicaid Other | Attending: Emergency Medicine | Admitting: Emergency Medicine

## 2018-02-02 ENCOUNTER — Emergency Department: Payer: Medicaid Other

## 2018-02-02 DIAGNOSIS — J9600 Acute respiratory failure, unspecified whether with hypoxia or hypercapnia: Secondary | ICD-10-CM | POA: Diagnosis not present

## 2018-02-02 DIAGNOSIS — Z7982 Long term (current) use of aspirin: Secondary | ICD-10-CM | POA: Diagnosis not present

## 2018-02-02 DIAGNOSIS — R7989 Other specified abnormal findings of blood chemistry: Secondary | ICD-10-CM | POA: Diagnosis not present

## 2018-02-02 DIAGNOSIS — I161 Hypertensive emergency: Secondary | ICD-10-CM | POA: Insufficient documentation

## 2018-02-02 DIAGNOSIS — R4182 Altered mental status, unspecified: Secondary | ICD-10-CM | POA: Diagnosis present

## 2018-02-02 DIAGNOSIS — Z79899 Other long term (current) drug therapy: Secondary | ICD-10-CM | POA: Insufficient documentation

## 2018-02-02 DIAGNOSIS — I629 Nontraumatic intracranial hemorrhage, unspecified: Secondary | ICD-10-CM | POA: Insufficient documentation

## 2018-02-02 DIAGNOSIS — F17210 Nicotine dependence, cigarettes, uncomplicated: Secondary | ICD-10-CM | POA: Insufficient documentation

## 2018-02-02 DIAGNOSIS — I10 Essential (primary) hypertension: Secondary | ICD-10-CM | POA: Insufficient documentation

## 2018-02-02 DIAGNOSIS — R778 Other specified abnormalities of plasma proteins: Secondary | ICD-10-CM

## 2018-02-02 DIAGNOSIS — I2510 Atherosclerotic heart disease of native coronary artery without angina pectoris: Secondary | ICD-10-CM | POA: Diagnosis not present

## 2018-02-02 LAB — BLOOD GAS, VENOUS
ACID-BASE EXCESS: 5.5 mmol/L — AB (ref 0.0–2.0)
BICARBONATE: 32.4 mmol/L — AB (ref 20.0–28.0)
O2 Saturation: 65.2 %
PATIENT TEMPERATURE: 37
PO2 VEN: 35 mmHg (ref 32.0–45.0)
pCO2, Ven: 56 mmHg (ref 44.0–60.0)
pH, Ven: 7.37 (ref 7.250–7.430)

## 2018-02-02 LAB — COMPREHENSIVE METABOLIC PANEL
ALT: 13 U/L (ref 0–44)
AST: 32 U/L (ref 15–41)
Albumin: 5.2 g/dL — ABNORMAL HIGH (ref 3.5–5.0)
Alkaline Phosphatase: 76 U/L (ref 38–126)
Anion gap: 11 (ref 5–15)
BILIRUBIN TOTAL: 0.7 mg/dL (ref 0.3–1.2)
BUN: 20 mg/dL (ref 6–20)
CO2: 30 mmol/L (ref 22–32)
Calcium: 9.5 mg/dL (ref 8.9–10.3)
Chloride: 95 mmol/L — ABNORMAL LOW (ref 98–111)
Creatinine, Ser: 1.28 mg/dL — ABNORMAL HIGH (ref 0.61–1.24)
GFR calc Af Amer: >60 mL/min (ref >60)
GFR calc non Af Amer: >60 mL/min (ref >60)
Glucose, Bld: 190 mg/dL — ABNORMAL HIGH (ref 70–99)
Potassium: 3.1 mmol/L — ABNORMAL LOW (ref 3.5–5.1)
Sodium: 136 mmol/L (ref 135–145)
Total Protein: 8.1 g/dL (ref 6.5–8.1)

## 2018-02-02 LAB — CBC WITH DIFFERENTIAL/PLATELET
ABS IMMATURE GRANULOCYTES: 0.06 10*3/uL (ref 0.00–0.07)
Basophils Absolute: 0.1 10*3/uL (ref 0.0–0.1)
Basophils Relative: 1 %
Eosinophils Absolute: 0.1 10*3/uL (ref 0.0–0.5)
Eosinophils Relative: 1 %
HCT: 44.2 % (ref 39.0–52.0)
Hemoglobin: 15 g/dL (ref 13.0–17.0)
Immature Granulocytes: 0 %
Lymphocytes Relative: 21 %
Lymphs Abs: 3 10*3/uL (ref 0.7–4.0)
MCH: 29.5 pg (ref 26.0–34.0)
MCHC: 33.9 g/dL (ref 30.0–36.0)
MCV: 87 fL (ref 80.0–100.0)
MONO ABS: 1.2 10*3/uL — AB (ref 0.1–1.0)
Monocytes Relative: 8 %
Neutro Abs: 9.4 10*3/uL — ABNORMAL HIGH (ref 1.7–7.7)
Neutrophils Relative %: 69 %
Platelets: 227 10*3/uL (ref 150–400)
RBC: 5.08 MIL/uL (ref 4.22–5.81)
RDW: 12.6 % (ref 11.5–15.5)
WBC: 13.8 10*3/uL — ABNORMAL HIGH (ref 4.0–10.5)
nRBC: 0 % (ref 0.0–0.2)

## 2018-02-02 LAB — PROTIME-INR
INR: 0.93
Prothrombin Time: 12.4 seconds (ref 11.4–15.2)

## 2018-02-02 LAB — MAGNESIUM: Magnesium: 2.1 mg/dL (ref 1.7–2.4)

## 2018-02-02 LAB — APTT: aPTT: 30 seconds (ref 24–36)

## 2018-02-02 LAB — ETHANOL: Alcohol, Ethyl (B): <10 mg/dL (ref <10)

## 2018-02-02 LAB — TROPONIN I: Troponin I: 0.06 ng/mL (ref <0.03)

## 2018-02-02 MED ORDER — ROCURONIUM BROMIDE 50 MG/5ML IV SOLN
100.0000 mg | Freq: Once | INTRAVENOUS | Status: AC
  Administered 2018-02-02: 100 mg via INTRAVENOUS

## 2018-02-02 MED ORDER — SODIUM BICARBONATE 8.4 % IV SOLN
50.0000 meq | Freq: Once | INTRAVENOUS | Status: AC
  Administered 2018-02-02: 50 meq via INTRAVENOUS

## 2018-02-02 MED ORDER — AMIODARONE HCL IN DEXTROSE 360-4.14 MG/200ML-% IV SOLN
INTRAVENOUS | Status: AC
  Administered 2018-02-02: 60 mg/h via INTRAVENOUS
  Filled 2018-02-02: qty 200

## 2018-02-02 MED ORDER — PROPOFOL 1000 MG/100ML IV EMUL
INTRAVENOUS | Status: AC
  Filled 2018-02-02: qty 100

## 2018-02-02 MED ORDER — NICARDIPINE HCL IN NACL 20-0.86 MG/200ML-% IV SOLN
5.0000 mg/h | INTRAVENOUS | Status: DC
  Administered 2018-02-02: 5 mg/h via INTRAVENOUS
  Filled 2018-02-02 (×2): qty 200

## 2018-02-02 MED ORDER — AMIODARONE HCL IN DEXTROSE 360-4.14 MG/200ML-% IV SOLN
60.0000 mg/h | INTRAVENOUS | Status: DC
  Administered 2018-02-02: 60 mg/h via INTRAVENOUS
  Filled 2018-02-02: qty 200

## 2018-02-02 MED ORDER — FENTANYL 2500MCG IN NS 250ML (10MCG/ML) PREMIX INFUSION
100.0000 ug/h | INTRAVENOUS | Status: DC
  Administered 2018-02-02: 100 ug/h via INTRAVENOUS

## 2018-02-02 MED ORDER — FENTANYL 2500MCG IN NS 250ML (10MCG/ML) PREMIX INFUSION
INTRAVENOUS | Status: AC
  Administered 2018-02-02: 100 ug/h via INTRAVENOUS
  Filled 2018-02-02: qty 250

## 2018-02-02 MED ORDER — ETOMIDATE 2 MG/ML IV SOLN
15.0000 mg | Freq: Once | INTRAVENOUS | Status: AC
  Administered 2018-02-02: 15 mg via INTRAVENOUS

## 2018-02-02 MED ORDER — MIDAZOLAM 50MG/50ML (1MG/ML) PREMIX INFUSION
0.5000 mg/h | INTRAVENOUS | Status: DC
  Administered 2018-02-02: 0.5 mg/h via INTRAVENOUS
  Filled 2018-02-02: qty 50

## 2018-02-02 MED ORDER — MIDAZOLAM HCL 5 MG/5ML IJ SOLN
4.0000 mg | Freq: Once | INTRAMUSCULAR | Status: AC
  Administered 2018-02-02: 4 mg via INTRAVENOUS

## 2018-02-02 MED ORDER — SUCCINYLCHOLINE CHLORIDE 20 MG/ML IJ SOLN: 100.0000 mg | Freq: Once | INTRAMUSCULAR | Status: DC

## 2018-02-02 MED ORDER — AMIODARONE HCL IN DEXTROSE 360-4.14 MG/200ML-% IV SOLN: 30.0000 mg/h | INTRAVENOUS | Status: DC

## 2018-02-02 MED ORDER — AMIODARONE IV BOLUS ONLY 150 MG/100ML
150.0000 mg | Freq: Once | INTRAVENOUS | Status: AC
  Administered 2018-02-02: 150 mg via INTRAVENOUS

## 2018-02-02 NOTE — ED Provider Notes (Signed)
[facility] Emergency Department Provider Note  ____________________________________________   First MD Initiated Contact with Patient 02/02/18 2311     (approximate)  I have reviewed the triage vital signs and the nursing notes.   HISTORY  Chief Complaint Code Stroke  Level 5 caveat:  history/ROS limited by acute/critical illness  HPI Jesus Duarte is a 53 y.o. male who is only known medical history as listed below.  He presents by EMS with concern for possible stroke.  History is limited but according to the paramedics, family told them that he seemed to have a normal day today until he had a fall this evening.  At some point after the fall the family realized that the patient was not able to get up by himself and seemed to not be moving his left side at all.  When paramedics arrived he was altered, gazing to the right but answering simple questions, would not look to the left, and would not move his left arm or his left leg.  They were going to [facility] but became concerned about his airway status as he became increasingly somnolent although he was still responding to his name and answering yes and no questions.  Upon arrival to the emergency department he appeared critically ill with rightward gaze deviation, no movement of the left arm, tachycardic in the 130s, severely diaphoretic, with responses to simple questions but also with sonorous respirations.  See hospital course for additional details.  Past Medical History:  Diagnosis Date  . Coronary artery disease   . Hypertension     Patient Active Problem List   Diagnosis Date Noted  . Hypertensive urgency 10/29/2014    Past Surgical History:  Procedure Laterality Date  . CARDIAC CATHETERIZATION      Prior to Admission medications   Medication Sig Start Date End Date Taking? Authorizing Provider  aspirin (BAYER ASPIRIN EC LOW DOSE) 81 MG EC tablet Take 81 mg by mouth daily. Swallow whole.     [provider]  naproxen (NAPROSYN) 500 MG tablet Take 1 tablet (500 mg total) by mouth 2 (two) times daily with a meal. 07/09/17   Triplett, Cari B, FNP    Allergies Crab [shellfish allergy] and Eggs or egg-derived products  Family History  Problem Relation Age of Onset  . Colon cancer Mother     Social History Social History   Tobacco Use  . Smoking status: Current Every Day Smoker    Packs/day: 0.25    Types: Cigarettes    Start date: 40  . Smokeless tobacco: Never Used  Substance Use Topics  . Alcohol use: No  . Drug use: No    Review of Systems Level 5 caveat:  history/ROS limited by acute/critical illness   ____________________________________________   PHYSICAL EXAM:  ED Triage Vitals  Enc Vitals Group     BP 02/02/18 2330 (!) 246/137     Pulse Rate 02/02/18 2315 (!) 189     Resp 02/02/18 2300 (!) 21     Temp 02/02/18 2330 (!) 96.4 F (35.8 C)     Temp src --      SpO2 02/02/18 2315 100 %     Weight 02/03/18 0007 72.6 kg (160 lb)     Height 02/03/18 0007 1.778 m (5\' 10" )     Head Circumference --      Peak Flow --      Pain Score --      Pain Loc --  Pain Edu? --      Excl. in GC? --      Constitutional: Toxic appearance, responds to yes and no questions and to his name. Eyes: Conjunctivae are normal.  Pupils are midsize but sluggish to respond but are equal bilaterally Head: Atraumatic. Nose: No congestion/rhinnorhea. Mouth/Throat: Mucous membranes are moist.  At the time of intubation there is heavy oropharyngeal debris secondary to emesis. Neck: No stridor.  No meningeal signs.   Cardiovascular: Tachycardia, regular rhythm. Good peripheral circulation. Grossly normal heart sounds. Respiratory: Intermittently sonorous respirations.  No retractions. Lungs CTAB. Gastrointestinal: Thin habitus.  Soft and nontender. No distention.  Musculoskeletal: No lower extremity tenderness nor edema. No gross deformities of  extremities. Neurologic: Minimally able to participate in exam.  He will grip my hand with his right hand but has no response when told to grip or move with his left arm.  He has rightward gaze deviation at baseline but will orient on my voice and face if I am midline or to his right, but he will not look to the left.  No other detailed exam is possible at this time Skin:  Skin is warm, heavily diaphoretic and intact. No rash noted.   ____________________________________________   LABS (all labs ordered are listed, but only abnormal results are displayed)  Labs Reviewed  BLOOD GAS, VENOUS - Abnormal; Notable for the following components:      Result Value   Bicarbonate 32.4 (*)    Acid-Base Excess 5.5 (*)    All other components within normal limits  COMPREHENSIVE METABOLIC PANEL - Abnormal; Notable for the following components:   Potassium 3.1 (*)    Chloride 95 (*)    Glucose, Bld 190 (*)    Creatinine, Ser 1.28 (*)    Albumin 5.2 (*)    All other components within normal limits  CBC WITH DIFFERENTIAL/PLATELET - Abnormal; Notable for the following components:   WBC 13.8 (*)    Neutro Abs 9.4 (*)    Monocytes Absolute 1.2 (*)    All other components within normal limits  TROPONIN I - Abnormal; Notable for the following components:   Troponin I 0.06 (*)    All other components within normal limits  ETHANOL  PROTIME-INR  APTT  MAGNESIUM  URINE DRUG SCREEN, QUALITATIVE ([facility] ONLY)  URINALYSIS, ROUTINE W REFLEX MICROSCOPIC  TYPE AND SCREEN   ____________________________________________  EKG  ED ECG REPORT #1 I, Loleta Rose, the attending physician, personally viewed and interpreted this ECG.  Date: 02/02/2018 EKG Time: 23:02 Rate: 122 Rhythm: sinus tachycardia QRS Axis: normal Intervals: normal ST/T Wave abnormalities: Non-specific ST segment / T-wave changes, but no clear evidence of acute ischemia. Narrative Interpretation: no definitive evidence of acute  ischemia; does not meet STEMI criteria.   ED ECG REPORT #2 I, Loleta Rose, the attending physician, personally viewed and interpreted this ECG.  Date: 02/02/2018 EKG Time: 23:15 Rate: 188 Rhythm: SVT vs V-tach QRS Axis: normal Intervals: normal ST/T Wave abnormalities: Non-specific ST segment / T-wave changes, but no clear evidence of acute ischemia. Narrative Interpretation: likely rate-related ischemia  ED ECG REPORT #3 I, Loleta Rose, the attending physician, personally viewed and interpreted this ECG.  Date: 02/02/2018 EKG Time: 23:20 Rate: 177 Rhythm: Ventricular tachycardia (with a pulse) QRS Axis: normal Intervals: normal ST/T Wave abnormalities: Deep depressions and T-wave inversions particularly in the lateral lead consistent with ischemia, likely rate related Narrative Interpretation: Likely rate related ischemia   ED ECG REPORT #4 I, Loleta Rose,  the attending physician, personally viewed and interpreted this ECG.  Date: 02/02/2018 EKG Time: 23: 21 Rate: 168 Rhythm: V. tach QRS Axis: normal Intervals: normal ST/T Wave abnormalities: Deep depressions and T-wave inversions particularly in the lateral lead consistent with ischemia, likely rate related Narrative Interpretation: Likely rate related ischemia     ____________________________________________  RADIOLOGY I, Loleta Rose, personally discussed these images and results by phone with the on-call radiologist and used this discussion as part of my medical decision making.   ED MD interpretation: Large right-sided intracranial bleed (approximately 8 x 4 x 4-1/2 cm with a volume of about 80 cm with right to left shift and a small amount of uncal herniation.  Probable small punctate cerebellar hemorrhage as well.  ET tube is in appropriate position on chest x-ray.  No cervical spine fracture.  Official radiology report(s): Dg Chest 1 View  Result Date: 02/03/2018 CLINICAL DATA:  Endotracheal and  enteric tube placement. EXAM: CHEST  1 VIEW COMPARISON:  Radiographs and CT 10/29/2014 FINDINGS: Endotracheal tube tip at the thoracic inlet 4.4 cm from the carina. Tip and side port of the enteric tube below the diaphragm in the stomach. Normal heart size and mediastinal contours. Minor elevation of left hemidiaphragm. Multiple overlying monitoring devices over the left chest limit assessment. No confluent airspace disease, large pleural effusion or pneumothorax. No evidence of pulmonary edema. No acute osseous abnormalities are seen. IMPRESSION: 1. Endotracheal tube tip at the thoracic inlet. Tip and side port of the enteric tube below the diaphragm in the stomach. 2. Mild elevation of left hemidiaphragm. Multiple overlying monitoring devices overlie the left chest which limits detailed assessment. Electronically Signed   By: Narda Rutherford M.D.   On: 02/03/2018 00:25   Dg Abdomen 1 View  Result Date: 02/03/2018 CLINICAL DATA:  Endotracheal and enteric tube placement. EXAM: ABDOMEN - 1 VIEW COMPARISON:  None. FINDINGS: Tip of the enteric tube below the diaphragm, side-port just beyond the gastroesophageal junction. Normal bowel gas pattern in the included upper abdomen. IMPRESSION: Tip and side port of the enteric tube below the diaphragm in the stomach. Electronically Signed   By: Narda Rutherford M.D.   On: 02/03/2018 00:26   Ct Cervical Spine Wo Contrast  Result Date: 02/03/2018 CLINICAL DATA:  53 y/o M; code stroke. Fell from chair, limp on left side and unresponsive. EXAM: CT HEAD WITHOUT CONTRAST CT CERVICAL SPINE WITHOUT CONTRAST TECHNIQUE: Multidetector CT imaging of the head and cervical spine was performed following the standard protocol without intravenous contrast. Multiplanar CT image reconstructions of the cervical spine were also generated. COMPARISON:  None. FINDINGS: CT HEAD FINDINGS Brain: Acute hemorrhage centered in the right basal ganglia measuring 8.3 x 4.0 x 4.6 cm (volume = 80  cm^3) (AP x ML x CC series 2, image 19 and series 4, image 36). Associated edema and mass effect with 11 mm of right-to-left midline shift, effacement of the right lateral ventricle, mild right-sided uncal herniation. Trace subarachnoid hemorrhage in the right sylvian fissure. Punctate density in the right cerebellar hemisphere may represent additional brain parenchymal hemorrhage or calcification (series 2, image 6). Chronic lacunar infarcts are present within left caudate head and lentiform nucleus. No ventricular entrapment or downward herniation at this time. Vascular: Calcific atherosclerosis of carotid siphons. Skull: Normal. Negative for fracture or focal lesion. Sinuses/Orbits: Partial opacification of the left mastoid air cells. Prior right sided wall up mastoidectomy chronic postsurgical changes. Orbits are unremarkable. Other: None. CT CERVICAL SPINE FINDINGS Alignment: Normal. Skull  base and vertebrae: No acute fracture. No primary bone lesion or focal pathologic process. Soft tissues and spinal canal: No prevertebral fluid or swelling. No visible canal hematoma. Disc levels: Mild discogenic degenerative changes greatest at the C6-7 level. No high-grade bony spinal canal stenosis. Upper chest: Negative. Other: Negative. IMPRESSION: 1. Large acute hemorrhage centered in right basal ganglia measuring up to 8.3 cm, 80 cc. 2. Punctate density in right cerebellum, possibly small acute hemorrhage or calcification. 3. Edema and mass effect associated with right basal ganglia hematoma with 11 mm right-to-left midline shift effacement of right lateral ventricle, and mild rightward uncal herniation. No ventricular entrapment or downward herniation at this time. 4. Trace volume of subarachnoid hemorrhage in right sylvian fissure. 5. Negative CT of the cervical spine. Critical Value/emergent results were called by telephone at the time of interpretation on 02/03/2018 at 12:08 am to Dr. Loleta RoseORY Ramla Hase , who verbally  acknowledged these results. Electronically Signed   By: Mitzi HansenLance  Furusawa-Stratton M.D.   On: 02/03/2018 00:12   Ct Head Code Stroke Wo Contrast  Result Date: 02/03/2018 CLINICAL DATA:  53 y/o M; code stroke. Fell from chair, limp on left side and unresponsive. EXAM: CT HEAD WITHOUT CONTRAST CT CERVICAL SPINE WITHOUT CONTRAST TECHNIQUE: Multidetector CT imaging of the head and cervical spine was performed following the standard protocol without intravenous contrast. Multiplanar CT image reconstructions of the cervical spine were also generated. COMPARISON:  None. FINDINGS: CT HEAD FINDINGS Brain: Acute hemorrhage centered in the right basal ganglia measuring 8.3 x 4.0 x 4.6 cm (volume = 80 cm^3) (AP x ML x CC series 2, image 19 and series 4, image 36). Associated edema and mass effect with 11 mm of right-to-left midline shift, effacement of the right lateral ventricle, mild right-sided uncal herniation. Trace subarachnoid hemorrhage in the right sylvian fissure. Punctate density in the right cerebellar hemisphere may represent additional brain parenchymal hemorrhage or calcification (series 2, image 6). Chronic lacunar infarcts are present within left caudate head and lentiform nucleus. No ventricular entrapment or downward herniation at this time. Vascular: Calcific atherosclerosis of carotid siphons. Skull: Normal. Negative for fracture or focal lesion. Sinuses/Orbits: Partial opacification of the left mastoid air cells. Prior right sided wall up mastoidectomy chronic postsurgical changes. Orbits are unremarkable. Other: None. CT CERVICAL SPINE FINDINGS Alignment: Normal. Skull base and vertebrae: No acute fracture. No primary bone lesion or focal pathologic process. Soft tissues and spinal canal: No prevertebral fluid or swelling. No visible canal hematoma. Disc levels: Mild discogenic degenerative changes greatest at the C6-7 level. No high-grade bony spinal canal stenosis. Upper chest: Negative. Other:  Negative. IMPRESSION: 1. Large acute hemorrhage centered in right basal ganglia measuring up to 8.3 cm, 80 cc. 2. Punctate density in right cerebellum, possibly small acute hemorrhage or calcification. 3. Edema and mass effect associated with right basal ganglia hematoma with 11 mm right-to-left midline shift effacement of right lateral ventricle, and mild rightward uncal herniation. No ventricular entrapment or downward herniation at this time. 4. Trace volume of subarachnoid hemorrhage in right sylvian fissure. 5. Negative CT of the cervical spine. Critical Value/emergent results were called by telephone at the time of interpretation on 02/03/2018 at 12:08 am to Dr. Loleta RoseORY Pattricia Weiher , who verbally acknowledged these results. Electronically Signed   By: Mitzi HansenLance  Furusawa-Stratton M.D.   On: 02/03/2018 00:12    ____________________________________________   PROCEDURES  Critical Care performed: Yes, see critical care procedure note(s)   Procedure(s) performed:   .Critical Care Performed by: York CeriseForbach,  Kandee Keen, MD Authorized by: Loleta Rose, MD   Critical care provider statement:    Critical care time (minutes):  120   Critical care time was exclusive of:  Separately billable procedures and treating other patients   Critical care was necessary to treat or prevent imminent or life-threatening deterioration of the following conditions:  CNS failure or compromise, cardiac failure and respiratory failure   Critical care was time spent personally by me on the following activities:  Development of treatment plan with patient or surrogate, discussions with consultants, evaluation of patient's response to treatment, examination of patient, obtaining history from patient or surrogate, ordering and performing treatments and interventions, ordering and review of laboratory studies, ordering and review of radiographic studies, pulse oximetry, re-evaluation of patient's condition and review of old charts Procedure  Name: Intubation Date/Time: 02/03/2018 12:29 AM Performed by: Loleta Rose, MD Pre-anesthesia Checklist: Patient identified, Emergency Drugs available, Suction available and Patient being monitored Preoxygenation: Pre-oxygenation with 100% oxygen Induction Type: IV induction and Rapid sequence Laryngoscope Size: Glidescope and 4 Tube size: 8.0 mm Number of attempts: 1 Placement Confirmation: ETT inserted through vocal cords under direct vision,  CO2 detector and Breath sounds checked- equal and bilateral Secured at: 26 cm Tube secured with: ETT holder Dental Injury: Teeth and Oropharynx as per pre-operative assessment  Comments: patient vomited just before RSI, heavy oropharyngeal contamination        ____________________________________________   INITIAL IMPRESSION / ASSESSMENT AND PLAN / ED COURSE  As part of my medical decision making, I reviewed the following data within the electronic MEDICAL RECORD NUMBER History obtained from family, Nursing notes reviewed and incorporated, Labs reviewed , EKG interpreted , Old chart reviewed, Radiograph reviewed , Discussed with neurosurgeon, Discussed with radiologist and Notes from prior ED visits    Differential diagnosis includes, but is not limited to, intracranial bleed (either traumatic or nontraumatic, uncertain based on the limited history), ischemic stroke, ACS, intoxication/drug abuse.  The patient is answering simple questions upon arrival but is clearly in extremis, not protecting his airway well and is very clearly neurologically compromised.  As we were preparing for RSI, the patient had a large volume of emesis, further compromising his airway.  As soon as possible I intubated him successfully.  Shortly after intubation he went into what appeared to be ventricular tachycardia with a rate of around 200.  He maintained a pulse but he was clearly hemodynamically unstable.  I performed 3 separate synchronized cardioversions as well as  ordering amiodarone 150 mg slow IV push.  At that point he converted back to a normal sinus rhythm in the 90s and I continue the amiodarone infusion.  His blood pressure was about 160/140 and I ordered nicardipine 5 mg/h with orders for titration.  We were then able to send him for emergent CT scan.  He was made code stroke immediately upon arrival.  He was able to deny chest pain and shortness of breath prior to RSI.   Clinical Course as of Feb 04 247  Wed Feb 03, 2018  0000 I spoke with the patient's sister in the family room.  She reports that he was acting strangely and "speaking gibberish" prior to the point that he got up to walk and was unable to do so.  I believe this was a nontraumatic intracranial bleed.  I reviewed the imaging myself and he does have a very large right-sided intracranial bleed with midline shift and what appears to be a small amount of uncal  herniation.   [CF]  0011 I spoke by phone with Dr. Adriana Simas with [facility] neuro[facility] who is covering area and see tonight.  He agreed with my plan (titrating nicardipine for goal SBP of about 150, mannitol, head of the bed elevation, Keppra 1000 mg IV, and ASAP transfer).  We have paged to the [facility] transfer center.  Patient currently hemodynamically stable, blood pressure has improved to about 187/100, titrating nicardipine up to 7.5 mg/h and will monitor carefully.  Head of the bed has been elevated to about 30 degrees.  I just spoke by phone with Dr. Harrie Jeans with radiology who identified not only the intracranial bleeding but a punctate hemorrhage in the cerebellum as well.   [CF]  0019 I spoke with the [facility] transfer center, they are contacting the neuro ICU attending to call me back   [CF]  0045 Mannitol crystallized prior to administration, awaiting new preparation from pharmacy   [CF]  [phone] Spoke with Dr. Rito Ehrlich with the [facility] neuro ICU.  She agreed with all the management we discussed and everything he has received thus far.  Of  note his blood pressure is improved and his systolic blood pressure is now 151 on nicardipine 7.5 mg/h.  He is stable on the ventilator.  The plan is for helicopter transport.   [CF]  0056 Coags were normal, urinalysis unremarkable, VBG unremarkable, CMP notable for a low potassium but otherwise generally reassuring, mild leukocytosis, negative alcohol, urine drug screen positive only for benzodiazepines.  The troponin is slightly elevated at 0.06 which is likely demand ischemia and related to his acute illness.   [CF]  0130 Still awaiting bed assignment from [facility] but the nurse is now calling report.  Versed was increased to 1 mg/h for some mild agitation.   [CF]  0131 Blood pressure continues to be right around 150 systolic which was our target.   [CF]  [phone] [facility] is at the bedside and I gave report to them personally.  The patient has been hemodynamically stable since my last notes.  Blood pressure is now 146/86 and he is starting a new bag of nicardipine for transport.   [CF]    Clinical Course User Index [CF] Loleta Rose, MD    ____________________________________________  FINAL CLINICAL IMPRESSION(S) / ED DIAGNOSES  Final diagnoses:  Intracranial bleed (HCC)  Acute respiratory failure, unspecified whether with hypoxia or hypercapnia [facility])  Hypertensive emergency     MEDICATIONS GIVEN DURING THIS VISIT:  Medications  midazolam (VERSED) 50mg  in NS 50mL (1mg /ml) premix infusion (0.5 mg/hr Intravenous New Bag/Given 02/02/18 2359)  fentaNYL in NS (59mcg/ml) infusion-PREMIX (150 mcg/hr Intravenous Rate/Dose Change 02/02/18 2334)  amiodarone (NEXTERONE PREMIX) 360-4.14 MG/200ML-% (1.8 mg/mL) IV infusion (60 mg/hr Intravenous New Bag/Given 02/02/18 2327)  amiodarone (NEXTERONE PREMIX) 360-4.14 MG/200ML-% (1.8 mg/mL) IV infusion (has no administration in time range)  nicardipine (CARDENE) 20mg  in 0.86% saline IV infusion (0.1 mg/ml) (7.5 mg/hr Intravenous  Rate/Dose Change 02/03/18 0016)  levETIRAcetam (KEPPRA) IVPB 1000 mg/100 mL premix (1,000 mg Intravenous New Bag/Given 02/03/18 0020)  mannitol 25 % injection 72.6 g (has no administration in time range)  mannitol 25 % injection 25 g (has no administration in time range)  etomidate (AMIDATE) injection 15 mg (15 mg Intravenous Given 02/02/18 2307)  rocuronium (ZEMURON) injection 100 mg (100 mg Intravenous Given 02/02/18 2308)  midazolam (VERSED) 5 MG/5ML injection 4 mg (4 mg Intravenous Given 02/02/18 2318)  amiodarone (NEXTERONE) IV bolus only 150 mg/100 mL (0 mg Intravenous  Stopped 02/02/18 2326)  sodium bicarbonate injection 50 mEq (50 mEq Intravenous Given 02/02/18 2322)     ED Discharge Orders    None       Note:  This document was prepared using Dragon voice recognition software and may include unintentional dictation errors.   Loleta RoseForbach, Ellesse Antenucci, MD 02/03/18 [phone]

## 2018-02-02 NOTE — ED Notes (Signed)
Pt currently vomiting spaghetti that pt had for dinner.

## 2018-02-02 NOTE — ED Notes (Signed)
Code stroke called  to [phone])

## 2018-02-02 NOTE — ED Notes (Signed)
Pt shocked by MD Forbach.  

## 2018-02-02 NOTE — ED Triage Notes (Signed)
Pt arrived via [facility] from home with c/o possible code stroke. EMS states that pt fell out of his chair and when the family went to try to get him up he was limp on his left side and then was no responsive. EMS states that pt has now been unable to respond other than to pain and verbal commands.

## 2018-02-02 NOTE — ED Notes (Signed)
Pt shocked by MD York CeriseForbach.

## 2018-02-03 ENCOUNTER — Emergency Department: Payer: Medicaid Other

## 2018-02-03 LAB — URINALYSIS, ROUTINE W REFLEX MICROSCOPIC
Bacteria, UA: NONE SEEN
Bilirubin Urine: NEGATIVE
Glucose, UA: 50 mg/dL — AB
Hgb urine dipstick: NEGATIVE
KETONES UR: NEGATIVE mg/dL
Leukocytes, UA: NEGATIVE
Nitrite: NEGATIVE
PROTEIN: 100 mg/dL — AB
Specific Gravity, Urine: 1.008 (ref 1.005–1.030)
Squamous Epithelial / HPF: NONE SEEN (ref 0–5)
pH: 8 (ref 5.0–8.0)

## 2018-02-03 LAB — URINE DRUG SCREEN, QUALITATIVE (ARMC ONLY)
Amphetamines, Ur Screen: NOT DETECTED
BARBITURATES, UR SCREEN: NOT DETECTED
Benzodiazepine, Ur Scrn: POSITIVE — AB
CANNABINOID 50 NG, UR ~~LOC~~: NOT DETECTED
COCAINE METABOLITE, UR ~~LOC~~: NOT DETECTED
MDMA (Ecstasy)Ur Screen: NOT DETECTED
METHADONE SCREEN, URINE: NOT DETECTED
Opiate, Ur Screen: NOT DETECTED
Phencyclidine (PCP) Ur S: NOT DETECTED
TRICYCLIC, UR SCREEN: NOT DETECTED

## 2018-02-03 LAB — BLOOD GAS, ARTERIAL
Acid-Base Excess: 4.7 mmol/L — ABNORMAL HIGH (ref 0.0–2.0)
Bicarbonate: 30.9 mmol/L — ABNORMAL HIGH (ref 20.0–28.0)
FIO2: 0.4
Mechanical Rate: 22
O2 Saturation: 98.5 %
PEEP: 5 cmH2O
Patient temperature: 37
VT: 450 mL
pCO2 arterial: 51 mmHg — ABNORMAL HIGH (ref 32.0–48.0)
pH, Arterial: 7.39 (ref 7.350–7.450)
pO2, Arterial: 117 mmHg — ABNORMAL HIGH (ref 83.0–108.0)

## 2018-02-03 LAB — TYPE AND SCREEN
ABO/RH(D): A POS
Antibody Screen: NEGATIVE

## 2018-02-03 MED ORDER — CHLORHEXIDINE GLUCONATE 0.12 % MT SOLN
5.00 | OROMUCOSAL | Status: DC
Start: 2018-02-05 — End: 2018-02-03

## 2018-02-03 MED ORDER — ACETAMINOPHEN 325 MG PO TABS
975.00 | ORAL_TABLET | ORAL | Status: DC
Start: ? — End: 2018-02-03

## 2018-02-03 MED ORDER — LABETALOL HCL 5 MG/ML IV SOLN
10.00 | INTRAVENOUS | Status: DC
Start: ? — End: 2018-02-03

## 2018-02-03 MED ORDER — LIDOCAINE HCL 1 % IJ SOLN
0.50 | INTRAMUSCULAR | Status: DC
Start: ? — End: 2018-02-03

## 2018-02-03 MED ORDER — FAMOTIDINE 20 MG/2ML IV SOLN
20.00 | INTRAVENOUS | Status: DC
Start: 2018-02-05 — End: 2018-02-03

## 2018-02-03 MED ORDER — LEVETIRACETAM IN NACL 1000 MG/100ML IV SOLN
1000.0000 mg | INTRAVENOUS | Status: AC
  Administered 2018-02-03: 1000 mg via INTRAVENOUS
  Filled 2018-02-03: qty 100

## 2018-02-03 MED ORDER — NICARDIPINE HCL IN NACL 40-0.83 MG/200ML-% IV SOLN
3.00 | INTRAVENOUS | Status: DC
Start: ? — End: 2018-02-03

## 2018-02-03 MED ORDER — GENERIC EXTERNAL MEDICATION
.50 | Status: DC
Start: ? — End: 2018-02-03

## 2018-02-03 MED ORDER — SODIUM CHLORIDE 4 MEQ/ML IV SOLN
120.00 | INTRAVENOUS | Status: DC
Start: 2018-02-03 — End: 2018-02-03

## 2018-02-03 MED ORDER — SODIUM CHLORIDE 0.9 % IV SOLN
INTRAVENOUS | Status: DC
Start: ? — End: 2018-02-03

## 2018-02-03 MED ORDER — HYDRALAZINE HCL 20 MG/ML IJ SOLN
10.00 | INTRAMUSCULAR | Status: DC
Start: ? — End: 2018-02-03

## 2018-02-03 MED ORDER — MANNITOL 25 % IV SOLN
25.0000 g | INTRAVENOUS | Status: DC
  Filled 2018-02-03: qty 100

## 2018-02-03 MED ORDER — MANNITOL 25 % IV SOLN
1.0000 g/kg | INTRAVENOUS | Status: AC
  Administered 2018-02-03: 72.6 g via INTRAVENOUS
  Filled 2018-02-03 (×2): qty 290.4

## 2018-02-03 MED ORDER — POLYETHYLENE GLYCOL 3350 17 G PO PACK
17.00 | PACK | ORAL | Status: DC
Start: 2018-02-05 — End: 2018-02-03

## 2018-02-03 MED ORDER — DEXMEDETOMIDINE HCL IN NACL 400 MCG/100ML IV SOLN
0.20 | INTRAVENOUS | Status: DC
Start: ? — End: 2018-02-03

## 2018-02-03 NOTE — ED Notes (Signed)
[facility] here to pick up patient.

## 2018-02-03 NOTE — Progress Notes (Signed)
During code stroke chaplain offered sister emotional support. She shared a life review including much grief, family distress, and complicated health concerns (Clois's father has cognitive decline and other significant health concerns). Sister knows very little of Antavious's life due to family separation and her lose of memory prior to 2006. Chaplain aided in getting directions for transfer to [facility]. Sister benefits from emotional support to aid in being in hospital setting due to previous hospital experiences.     02/03/18 0200  Clinical Encounter Type  Visited With Patient and family together  Visit Type ED;Critical Care  Referral From Nurse  Spiritual Encounters  Spiritual Needs Emotional

## 2018-02-03 NOTE — ED Notes (Signed)
Unable to obtain transfer consent due to pt intubated and no family present.

## 2018-02-05 MED ORDER — NOREPINEPHRINE-SODIUM CHLORIDE 16-0.9 MG/250ML-% IV SOLN
0.01 | INTRAVENOUS | Status: DC
Start: ? — End: 2018-02-05

## 2018-02-05 MED ORDER — DEXTROSE-NACL 5-0.45 % IV SOLN
INTRAVENOUS | Status: DC
Start: ? — End: 2018-02-05

## 2018-02-06 DEATH — deceased

## 2020-07-20 IMAGING — CT CT CERVICAL SPINE W/O CM
4 of 7 series · 14 of 33 positions shown, 15 images · non-contrast
Comparison: None.

CLINICAL DATA: 52 y/o M; code stroke. Fell from chair, limp on left
side and unresponsive.

EXAM:
CT HEAD WITHOUT CONTRAST
CT CERVICAL SPINE WITHOUT CONTRAST
TECHNIQUE: Multidetector CT imaging of the head and cervical spine was
performed following the standard protocol without intravenous
contrast. Multiplanar CT image reconstructions of the cervical spine
were also generated.

[Series 7: c spine soft · axial · 0.39mm/px · z∈[+180,+288]mm · 4 of 92 slices shown]
[im 19/92  soft-tissue]
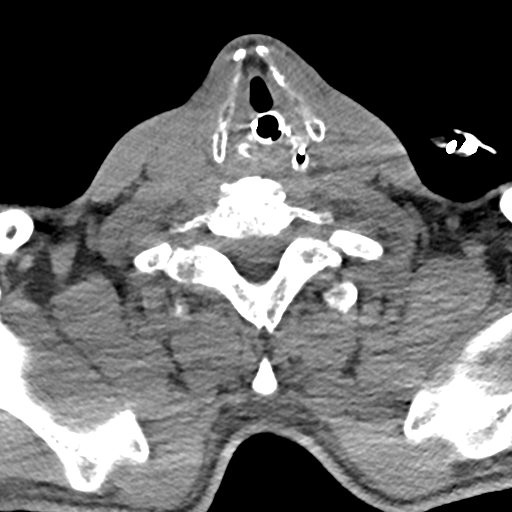
[im 37/92  soft-tissue]
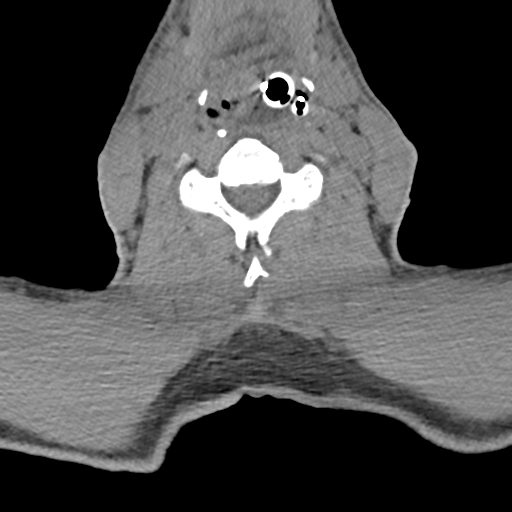
[im 55/92  soft-tissue]
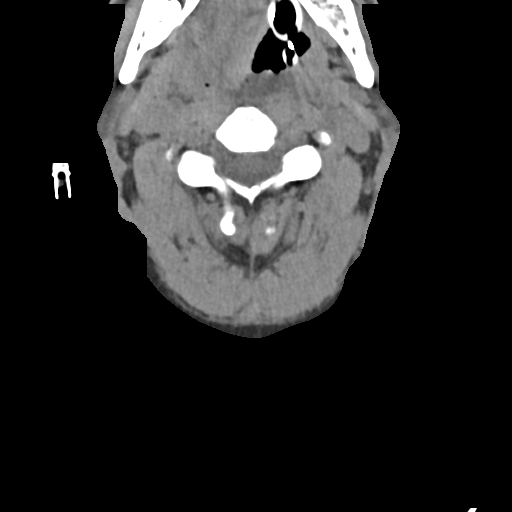
[im 73/92  soft-tissue]
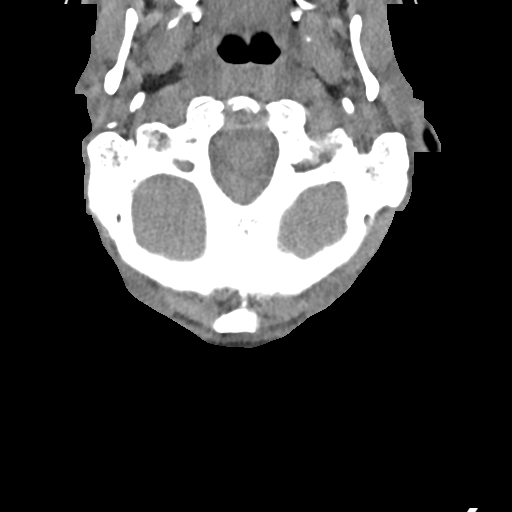

[Series 8: sagittal bone · sagittal · 0.27mm/px · 5 of 56 slices shown]
[im 10/56  bone]
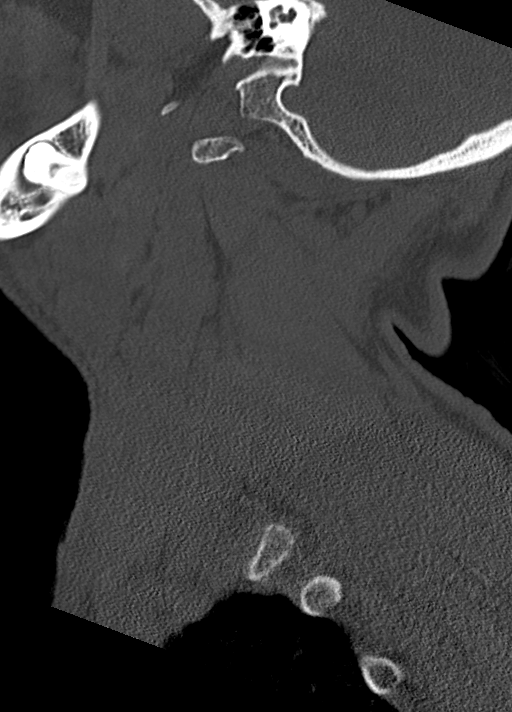
[im 19/56  bone]
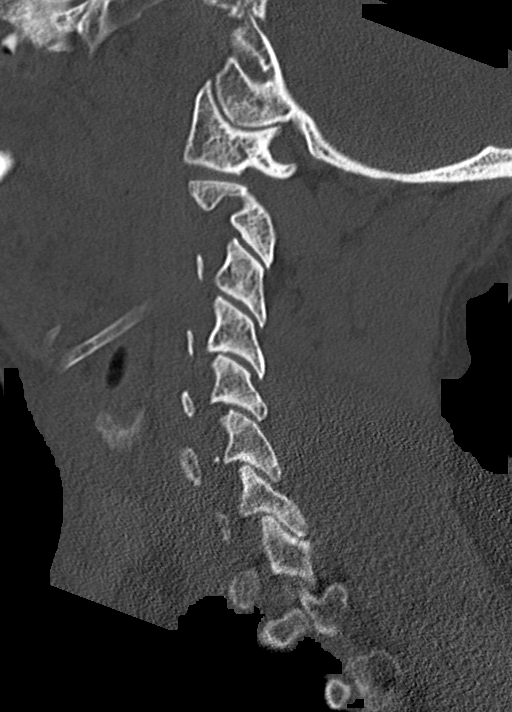
[im 28/56  bone]
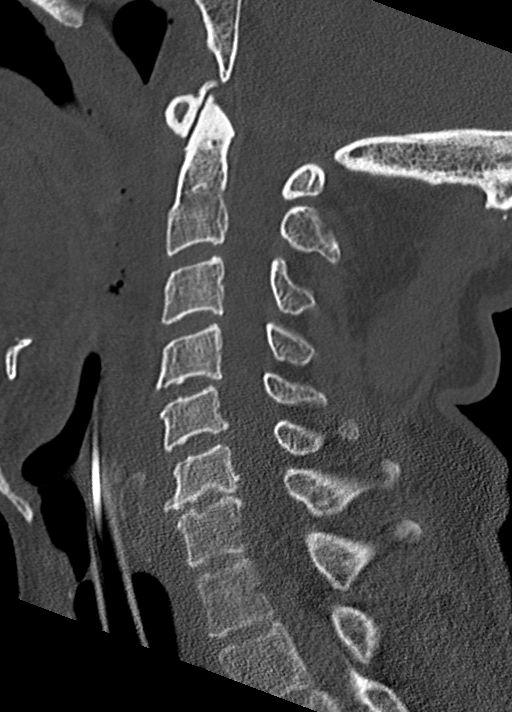
[im 37/56  bone]
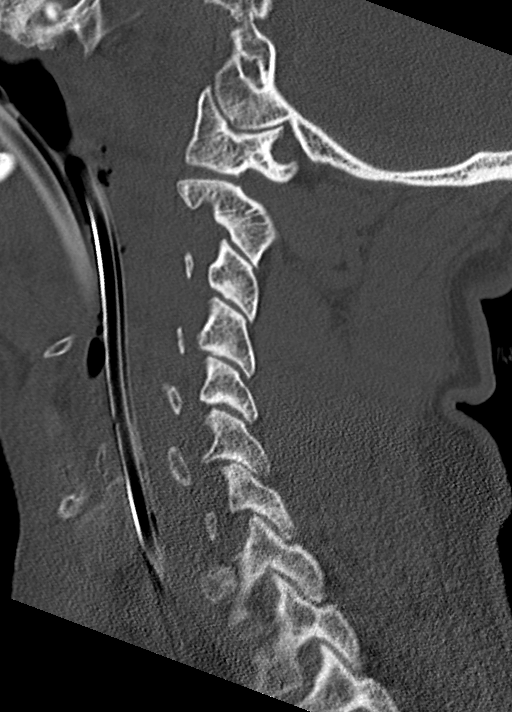
[im 46/56  bone]
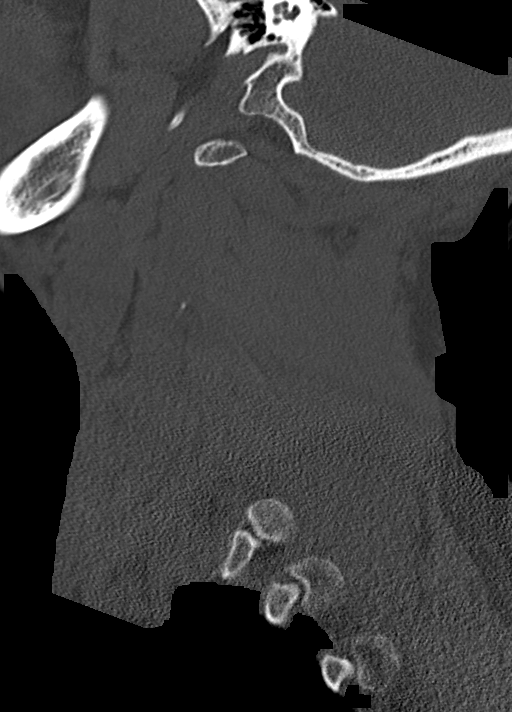

[Series 9: coronal bone · coronal · 0.26mm/px · 1 of 54 slices shown]
[im 27/54  bone]
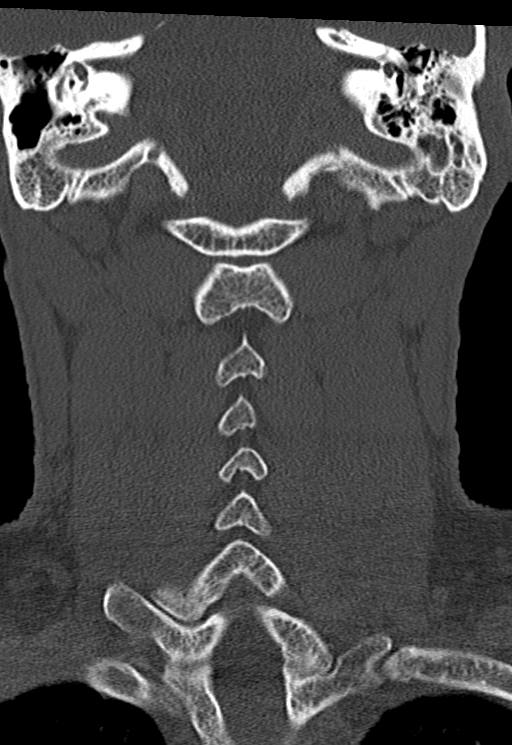

[Series 10: orthogonal bone · axial · 0.24mm/px · z∈[+149,+249]mm · 4 of 93 slices shown, 5 images]
[im 19/93  soft-tissue]
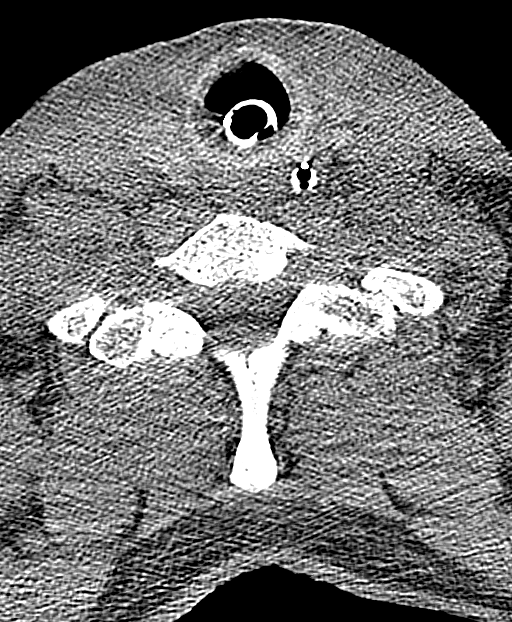
[im 19/93  bone]
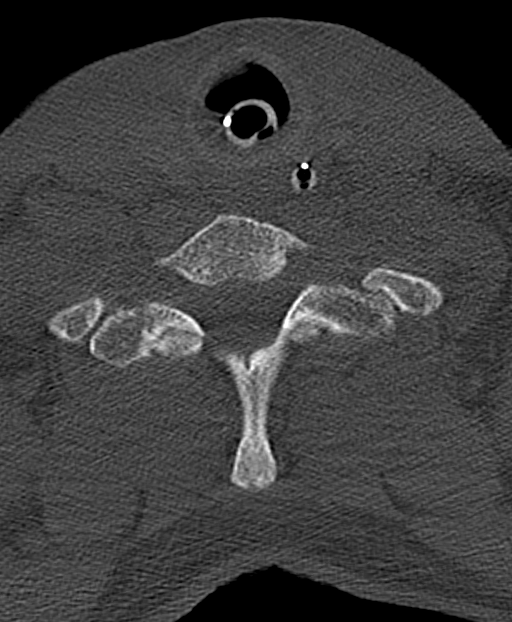
[im 37/93  bone]
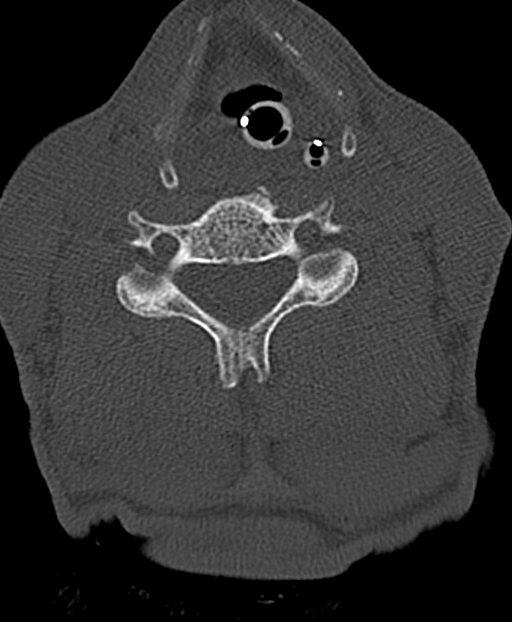
[im 56/93  bone]
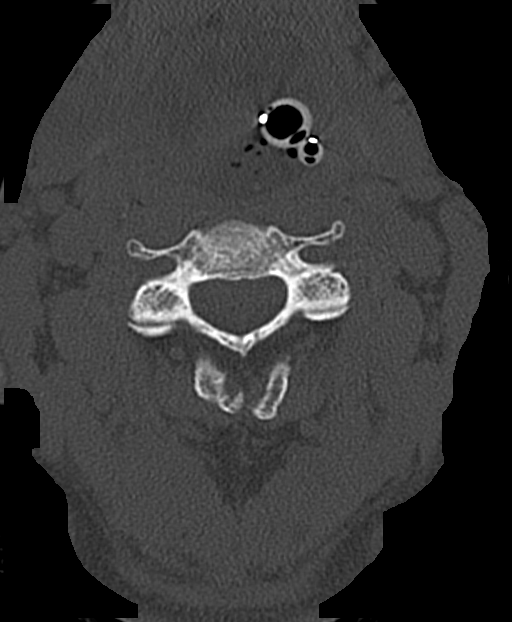
[im 74/93  bone]
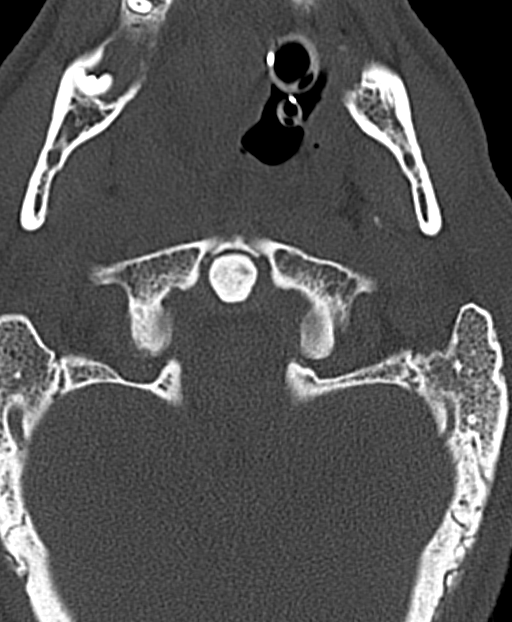

[14 of 33 positions shown; findings below may reference images not displayed]

FINDINGS: CT HEAD FINDINGS

Brain: Acute hemorrhage centered in the right basal ganglia
measuring 8.3 x 4.0 x 4.6 cm (volume = 80 cm^3) (AP x ML x CC
series 2, image 19 and series 4, image 36). Associated edema and
mass effect with 11 mm of right-to-left midline shift, effacement of
the right lateral ventricle, mild right-sided uncal herniation.
Trace subarachnoid hemorrhage in the right sylvian fissure.

Punctate density in the right cerebellar hemisphere may represent
additional brain parenchymal hemorrhage or calcification (series 2,
image 6).

Chronic lacunar infarcts are present within left caudate head and
lentiform nucleus. No ventricular entrapment or downward herniation
at this time.

Vascular: Calcific atherosclerosis of carotid siphons.

Skull: Normal. Negative for fracture or focal lesion.

Sinuses/Orbits: Partial opacification of the left mastoid air cells.
Prior right sided wall up mastoidectomy chronic postsurgical
changes. Orbits are unremarkable.

Other: None.

CT CERVICAL SPINE FINDINGS

Alignment: Normal.

Skull base and vertebrae: No acute fracture. No primary bone lesion
or focal pathologic process.

Soft tissues and spinal canal: No prevertebral fluid or swelling. No
visible canal hematoma.

Disc levels: Mild discogenic degenerative changes greatest at the
C6-7 level. No high-grade bony spinal canal stenosis.

Upper chest: Negative.

Other: Negative.
IMPRESSION: 1. Large acute hemorrhage centered in right basal ganglia measuring
up to 8.3 cm, 80 cc.
2. Punctate density in right cerebellum, possibly small acute
hemorrhage or calcification.
3. Edema and mass effect associated with right basal ganglia
hematoma with 11 mm right-to-left midline shift effacement of right
lateral ventricle, and mild rightward uncal herniation. No
ventricular entrapment or downward herniation at this time.
4. Trace volume of subarachnoid hemorrhage in right sylvian fissure.
5. Negative CT of the cervical spine.

Critical Value/emergent results were called by telephone at the time
of interpretation on 02/03/2018 at [DATE] to Dr. NGOTO KAYOU , who
verbally acknowledged these results.
